# Patient Record
Sex: Female | Born: 1985
Health system: Southern US, Community
[De-identification: ages and names within clinical notes are randomized; demographics above are authoritative.]

## PROBLEM LIST (undated history)

## (undated) DIAGNOSIS — R000 Tachycardia, unspecified: Secondary | ICD-10-CM

## (undated) DIAGNOSIS — D649 Anemia, unspecified: Secondary | ICD-10-CM

## (undated) DIAGNOSIS — R030 Elevated blood-pressure reading, without diagnosis of hypertension: Secondary | ICD-10-CM

## (undated) DIAGNOSIS — N920 Excessive and frequent menstruation with regular cycle: Secondary | ICD-10-CM

## (undated) DIAGNOSIS — R002 Palpitations: Secondary | ICD-10-CM

## (undated) DIAGNOSIS — O4690 Antepartum hemorrhage, unspecified, unspecified trimester: Secondary | ICD-10-CM

## (undated) DIAGNOSIS — J45909 Unspecified asthma, uncomplicated: Secondary | ICD-10-CM

## (undated) DIAGNOSIS — N912 Amenorrhea, unspecified: Secondary | ICD-10-CM

## (undated) DIAGNOSIS — Z87891 Personal history of nicotine dependence: Secondary | ICD-10-CM

## (undated) DIAGNOSIS — D509 Iron deficiency anemia, unspecified: Secondary | ICD-10-CM

## (undated) DIAGNOSIS — E348 Other specified endocrine disorders: Secondary | ICD-10-CM

## (undated) DIAGNOSIS — K649 Unspecified hemorrhoids: Secondary | ICD-10-CM

## (undated) DIAGNOSIS — Z8639 Personal history of other endocrine, nutritional and metabolic disease: Secondary | ICD-10-CM

## (undated) DIAGNOSIS — G5602 Carpal tunnel syndrome, left upper limb: Secondary | ICD-10-CM

---
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
## 2004-12-14 ENCOUNTER — Ambulatory Visit: Payer: Self-pay | Admitting: Family Medicine

## 2008-01-25 ENCOUNTER — Ambulatory Visit: Payer: Self-pay | Admitting: Family Medicine

## 2009-09-21 ENCOUNTER — Ambulatory Visit: Payer: Self-pay | Admitting: Family Medicine

## 2009-09-22 ENCOUNTER — Emergency Department: Payer: Self-pay | Admitting: Internal Medicine

## 2010-08-05 ENCOUNTER — Ambulatory Visit: Payer: Self-pay | Admitting: Internal Medicine

## 2012-04-26 ENCOUNTER — Encounter: Payer: Self-pay | Admitting: Obstetrics & Gynecology

## 2012-08-19 ENCOUNTER — Observation Stay: Payer: Self-pay

## 2012-10-07 ENCOUNTER — Observation Stay: Payer: Self-pay | Admitting: Obstetrics and Gynecology

## 2012-10-17 ENCOUNTER — Inpatient Hospital Stay: Payer: Self-pay

## 2012-10-17 LAB — CBC WITH DIFFERENTIAL/PLATELET
Basophil %: 0.2 %
Eosinophil #: 0.2 10*3/uL (ref 0.0–0.7)
Eosinophil %: 1.7 %
HCT: 34.4 % — ABNORMAL LOW (ref 35.0–47.0)
HGB: 11.8 g/dL — ABNORMAL LOW (ref 12.0–16.0)
Lymphocyte #: 2.5 10*3/uL (ref 1.0–3.6)
Lymphocyte %: 19.5 %
MCHC: 34.2 g/dL (ref 32.0–36.0)
MCV: 81 fL (ref 80–100)
Monocyte %: 7.5 %
Neutrophil #: 9 10*3/uL — ABNORMAL HIGH (ref 1.4–6.5)
Neutrophil %: 71.1 %
RBC: 4.24 10*6/uL (ref 3.80–5.20)
RDW: 15.6 % — ABNORMAL HIGH (ref 11.5–14.5)

## 2012-10-18 LAB — PIH PROFILE
Anion Gap: 9 (ref 7–16)
BUN: 13 mg/dL (ref 7–18)
Chloride: 109 mmol/L — ABNORMAL HIGH (ref 98–107)
Co2: 22 mmol/L (ref 21–32)
Creatinine: 0.69 mg/dL (ref 0.60–1.30)
HGB: 12.2 g/dL (ref 12.0–16.0)
MCH: 27.7 pg (ref 26.0–34.0)
MCHC: 34 g/dL (ref 32.0–36.0)
MCV: 82 fL (ref 80–100)
Potassium: 3.9 mmol/L (ref 3.5–5.1)
RBC: 4.42 10*6/uL (ref 3.80–5.20)
SGOT(AST): 19 U/L (ref 15–37)
Sodium: 140 mmol/L (ref 136–145)
Uric Acid: 4.2 mg/dL (ref 2.6–6.0)

## 2012-10-20 LAB — HEMATOCRIT: HCT: 28.2 % — ABNORMAL LOW

## 2012-10-22 LAB — PATHOLOGY REPORT

## 2014-05-10 ENCOUNTER — Emergency Department: Payer: Self-pay | Admitting: Emergency Medicine

## 2014-05-10 LAB — CBC
HCT: 42 % (ref 35.0–47.0)
HGB: 14 g/dL (ref 12.0–16.0)
MCH: 27.5 pg (ref 26.0–34.0)
MCHC: 33.4 g/dL (ref 32.0–36.0)
MCV: 82 fL (ref 80–100)
Platelet: 248 10*3/uL (ref 150–440)
RBC: 5.1 10*6/uL (ref 3.80–5.20)
RDW: 12.7 % (ref 11.5–14.5)
WBC: 8.6 10*3/uL (ref 3.6–11.0)

## 2014-05-10 LAB — BASIC METABOLIC PANEL
Anion Gap: 10 (ref 7–16)
BUN: 13 mg/dL (ref 7–18)
CALCIUM: 8.9 mg/dL (ref 8.5–10.1)
CO2: 25 mmol/L (ref 21–32)
Chloride: 106 mmol/L (ref 98–107)
Creatinine: 0.7 mg/dL (ref 0.60–1.30)
EGFR (African American): >60
EGFR (Non-African Amer.): >60
GLUCOSE: 114 mg/dL — AB (ref 65–99)
OSMOLALITY: 282 (ref 275–301)
Potassium: 3.5 mmol/L (ref 3.5–5.1)
Sodium: 141 mmol/L (ref 136–145)

## 2014-05-10 LAB — TROPONIN I

## 2015-04-07 NOTE — Op Note (Signed)
PATIENT NAME:  Wendy Hunt, Wendy Hunt MR#:  [id number] DATE OF BIRTH:  03/28/1986  DATE OF PROCEDURE:  10/19/2012  PREOPERATIVE DIAGNOSES:  1. Acute phase arrest. 2. 37 + [redacted] weeks gestation.  3. Mild preeclampsia.   POSTOPERATIVE DIAGNOSES:  1. Acute phase arrest. 2. 37 + [redacted] weeks gestation.  3. Mild preeclampsia.   PROCEDURES:  1. Primary low transverse cesarean section.  2. On-Q pump placement.   ANESTHESIA: Surgical dosing of continuous lumbar epidural.   SURGEON: Suzy Bouchard, MD  FIRST ASSISTANTYetta Barre nurse midwife.   INDICATIONS: A 29 year old gravida 1, para 0 patient at 30 + 1 weeks at time of delivery. Patient was admitted 28 hours previously and was started on Cervidil induction. Patient did not progress past 6 cm despite adequate contraction pattern.   DESCRIPTION OF PROCEDURE: After adequate surgical dosing of continuous lumbar epidural, patient was placed in dorsal supine position hip roll under the right side. Patient's abdomen was prepped and draped in normal sterile fashion. She did receive 2 grams IV Ancef prior to commencement of the case. Pfannenstiel incision was made two fingerbreadths above the symphysis pubis. Sharp dissection was used to identify the fascia which was opened in the midline and opened in a transverse fashion. Superior aspect of fascia was grasped with Kocher clamps. Recti muscles dissected free. Inferior aspect of the fascia was grasped with Kocher clamps. Pyramidalis muscle was dissected free. Entry into the peritoneal cavity was accomplished sharply. Vesicouterine peritoneal fold was identified and opened and bladder flap was created and bladder was reflected inferiorly. Low transverse uterine incision was made. Upon entry into the endometrial cavity, clear fluid resulted. Wedged fetal head was brought to the incision. Vacuum was applied to the occiput, with one gentle pull of the vacuum fetal head was delivered. Vacuum was removed. Loose nuchal  cord was reduced followed by delivery of the shoulders and the body without difficulty. Vigorous female was passed to nursery staff who assigned Apgar scores of 8 and 9. Patient was having some excessive pain during this part of the procedure and received IV ketamine and IV Versed. Placenta was then manually delivered and the uterus was exteriorized. Endometrial cavity was wiped clean with laparotomy tape. Placenta will be sent to pathology for identification given the mother had a temperature five minutes before start time of 100.6. Uterine incision was then closed with 1 chromic suture in a running locking fashion, good approximation of edges. Several figure-of-eight sutures used for good hemostasis. Ovaries and fallopian tubes appeared normal. The posterior cul-de-sac was irrigated and suctioned and uterus was placed back into the abdominal cavity. Paracolic gutters were wiped clean with laparotomy tape. Uterine incision again appeared hemostatic and Interceed placed over the lower uterine incision in a T-shaped fashion. Superior aspect of the fascia was grasped with Kocher clamps and the On-Q pump catheters were advanced from the infraumbilical site subfascially. Each catheter was advanced subfascially. The fascia was then closed over top of the catheters with 0 Vicryl suture in a running nonlocking fashion. Subcutaneous tissues were irrigated and bovied for hemostasis and skin was reapproximated with staples. The On-Q pump catheters were loaded with 5 mL of 0.5% Marcaine each. Sterile dressing applied onto the catheters and dressing applied to the incision. No complications. Estimated blood loss 800 mL. Intraoperative fluids 1200 mL. Patient tolerated the procedure well and was taken to recovery room in good condition.   ____________________________ Suzy Bouchard, MD tjs:cms D: 10/19/2012 02:37:16 ET T: 10/19/2012 07:41:32 ET JOB#:  [id number]  cc: Suzy Bouchardhomas J. Schermerhorn, MD, <Dictator> Suzy BouchardHOMAS J  SCHERMERHORN MD ELECTRONICALLY SIGNED 10/22/2012 8:50

## 2015-04-07 NOTE — Discharge Summary (Signed)
PATIENT NAME:  Wendy Hunt, Yanessa E MR#:  [id number] DATE OF BIRTH:  [birth year]  DATE OF ADMISSION:  10/17/2012 DATE OF DISCHARGE:  10/22/2012  HOSPITAL COURSE: The patient came in for an induction, advanced to 6 cm, did not progress past that. After adequate evaluation and contraction pattern, the patient underwent a primary low transverse cesarean section, which was uncomplicated. Postoperative day number one hematocrit 28.2%. The patient was discharged to home on postoperative day number three. Blood pressure is normalizing. The patient's discharge medications are Norco and Motrin. The patient will follow-up with Dr. Feliberto GottronSchermerhorn in two weeks for wound care or before if she has problems with wound drainage, fever, nausea, or vomiting.  ____________________________ Suzy Bouchardhomas J. Eryx Zane, MD tjs:slb D: 10/25/2012 09:09:26 ET T: 10/25/2012 14:25:37 ET JOB#: [id number]  cc: Suzy Bouchardhomas J. Chealsey Miyamoto, MD, <Dictator> Suzy BouchardHOMAS J Ilea Hilton MD ELECTRONICALLY SIGNED 10/28/2012 10:53

## 2015-04-28 NOTE — H&P (Signed)
L&D Evaluation:  History:   HPI 29 yo G1P0 with LMP of 01/22/12 & EDD of 11/07/12 [facility] "irreg periods and infertility patient" who was walking a mile today and now c/o "pain in the lower groin and lower pelvic discomfort now", No LOF, no VB or decreased FM.    Presents with abdominal pain    Patient's Medical History 4'11" Ht, Irreg periods and infertility over many years    Patient's Surgical History wisdom teeth extraction    Medications Pre Natal Vitamins    Allergies Sulfa    Social History none    Family History Non-Contributory   ROS:   ROS All systems were reviewed.  HEENT, CNS, GI, GU, Respiratory, CV, Renal and Musculoskeletal systems were found to be normal.   Exam:   Vital Signs stable  120/80 previously 130/86    Urine Protein trace    General no apparent distress    Mental Status clear    Chest clear    Heart normal sinus rhythm, no murmur/gallop/rubs    Abdomen gravid, non-tender    Estimated Fetal Weight Average for gestational age    Fetal Position Footling breech    Back no CVAT    Edema 1+    Reflexes 1+    Clonus negative    Pelvic L/C/P no presenting part    Mebranes Intact    FHT normal rate with no decels, NST age approp    Ucx irregular    Skin dry    Lymph no lymphadenopathy   Impression:   Impression IUP at 28 5/7 week with Th PTL   Plan:   Plan monitor contractions and for cervical change, PIH panel, Pt upset and crying as she is fearful ZO:XWRUre:baby and safety    Comments Terb x 1 will be given.   Electronic Signatures: Sharee PimpleJones, Caron W (CNM)  (Signed 01-Sep-13 22:58)  Authored: L&D Evaluation   Last Updated: 01-Sep-13 22:58 by Sharee PimpleJones, Caron W (CNM)

## 2015-04-28 NOTE — H&P (Signed)
L&D Evaluation:  History:   HPI [age]yo G1P0 with LMP of 01/22/12 & [facility] 11[facility]07/[facility], primip, Th PTL, polyhydramnios, EFW yest 6#13, Anemia, 24 h prot/creat 10/16 normal. Pt has agreed to IOL for Gest HTN and poly and is 37 weeks today. Pt is 294ft11 and at risk for CPD.    Presents with IOL    Patient's Medical History Anemia    Patient's Surgical History none    Medications Pre Natal Vitamins    Allergies Sulfa    Social History none    Family History Non-Contributory   ROS:   ROS All systems were reviewed.  HEENT, CNS, GI, GU, Respiratory, CV, Renal and Musculoskeletal systems were found to be normal.   Exam:   Vital Signs stable    General no apparent distress    Mental Status clear    Chest clear    Heart normal sinus rhythm, no murmur/gallop/rubs    Abdomen gravid, non-tender    Estimated Fetal Weight Average for gestational age    Fetal Position vtx    Back no CVAT    Edema 2+    Reflexes 2+    Pelvic no external lesions    Mebranes Intact    FHT normal rate with no decels, 130    Ucx regular    Skin dry    Lymph no lymphadenopathy   Impression:   Impression IUP at 37 weeks with GEst HTN   Plan:   Plan EFM/NST, monitor contractions and for cervical change    Comments Disc at length with pt and husband and pt's mom risks of labor induction including failure and need for lTCS. Disc goal for today is safe mOM AND SAFE BABY!. Cx is closed/mid/40%/vtx -1. Pt does agree to continue with trial of labor. Aware of the risks of poly and cord prolapse and need for STAT C-section.  Pt informed of Pitocin and the risks including maternal or fetal intolerance. Pt aware of PP hemorrhage, abruption of uturus, risks of anethesia and bleeding possibilities with delivery. Aware of risks of infection. We also disc risks of LTCS including damage to internal organs, risks of bleeding, infection, anesthesia risks and pt  understands that surgery is a risk. WE also addressed the risks of death. Pt and husband agree with plan for today. Dr. Feliberto GottronSchermerhorn is aware of pt status and agrees with plans for IOL. Epidural wll be done now so that we can intervene with vag exams and possibly foly bulb. Pt is unable to have cx exams without rearing back in the bed due to discomfort so feel epidural is indicated to get the pt started.   Electronic Signatures: Sharee PimpleJones, Caron W (CNM)  (Signed 31-Oct-13 09:12)  Authored: L&D Evaluation   Last Updated: 31-Oct-13 09:12 by Sharee PimpleJones, Caron W (CNM)

## 2015-07-17 ENCOUNTER — Encounter: Payer: Self-pay | Admitting: Family Medicine

## 2015-07-17 ENCOUNTER — Ambulatory Visit (INDEPENDENT_AMBULATORY_CARE_PROVIDER_SITE_OTHER): Payer: BLUE CROSS/BLUE SHIELD | Admitting: Family Medicine

## 2015-07-17 VITALS — BP 100/64 | HR 92 | Temp 98.2°F | Resp 16 | Ht 59.0 in | Wt 146.0 lb

## 2015-07-17 DIAGNOSIS — J45909 Unspecified asthma, uncomplicated: Secondary | ICD-10-CM | POA: Insufficient documentation

## 2015-07-17 DIAGNOSIS — K649 Unspecified hemorrhoids: Secondary | ICD-10-CM | POA: Diagnosis not present

## 2015-07-17 DIAGNOSIS — N920 Excessive and frequent menstruation with regular cycle: Secondary | ICD-10-CM | POA: Insufficient documentation

## 2015-07-17 DIAGNOSIS — K648 Other hemorrhoids: Secondary | ICD-10-CM | POA: Diagnosis not present

## 2015-07-17 DIAGNOSIS — R635 Abnormal weight gain: Secondary | ICD-10-CM | POA: Diagnosis not present

## 2015-07-17 DIAGNOSIS — K644 Residual hemorrhoidal skin tags: Secondary | ICD-10-CM | POA: Insufficient documentation

## 2015-07-17 DIAGNOSIS — D509 Iron deficiency anemia, unspecified: Secondary | ICD-10-CM | POA: Insufficient documentation

## 2015-07-17 MED ORDER — PHENTERMINE HCL 30 MG PO CAPS: 30.0000 mg | ORAL_CAPSULE | ORAL | Status: DC

## 2015-07-17 NOTE — Progress Notes (Signed)
   Subjective:    Patient ID: Marline Backbone Ward, female    DOB: [birth year], 29 y.o.   MRN: [id number]  HPI   Hemorrhoids: Patient complains of follow up of hemorrhoids. Onset of symptoms was gradual starting several years ago ago with stable course since that time.  She describes symptoms as Pt has not had a symptoms. Treatment to date has been none.  Also, concerned about her weight. Has been on Weight Watchers for 6 weeks. She has lost 4 pounds. Feels like she needs a something to give her a jump start.     Patient Active Problem List   Diagnosis Date Noted  . Anemia, iron deficiency 07/17/2015  . Excess, menstruation 07/17/2015  . RAD (reactive airway disease) 07/17/2015   Family History  Problem Relation Age of Onset  . Healthy Mother   . Hyperlipidemia Mother   . Healthy Father     Pre-DM and hypochondriac  . Diabetes Maternal Grandfather   . Diabetes Paternal Grandmother   . Breast cancer Paternal Grandmother   . Esophageal cancer Paternal Grandfather   . Diabetes Other   . Glaucoma Other   . Angina Maternal Grandmother    History   Social History  . Marital Status: Married    Spouse Name: N/A  . Number of Children: 1  . Years of Education: College   Occupational History  .      Full-time   Social History Main Topics  . Smoking status: Former Smoker -- 0.50 packs/day for 8 years  . Smokeless tobacco: Never Used  . Alcohol Use: No  . Drug Use: No  . Sexual Activity: Not on file   Other Topics Concern  . Not on file   Social History Narrative   Allergies  Allergen Reactions  . Sulfa Antibiotics    Previous Medications   ETONOGESTREL (IMPLANON) 68 MG IMPL IMPLANT    Inject into the skin.   BP 100/64 mmHg  Pulse 92  Temp(Src) 98.2 F (36.8 C) (Oral)  Resp 16  Ht  (1.499 m)  Wt 146 lb (66.225 kg)  BMI 29.47 kg/m2  LMP 07/13/2015 (Exact Date)    Review of Systems  Constitutional: Negative.   Gastrointestinal: Positive for blood in stool  (Occasionally with hemorrhoid flare) and rectal pain (occasionally ).      Objective:   Physical Exam  Constitutional: She is oriented to person, place, and time. She appears well-developed and well-nourished.  Genitourinary: Rectal exam shows external hemorrhoid.  Neurological: She is alert and oriented to person, place, and time.   BP 100/64 mmHg  Pulse 92  Temp(Src) 98.2 F (36.8 C) (Oral)  Resp 16  Ht  (1.499 m)  Wt 146 lb (66.225 kg)  BMI 29.47 kg/m2  LMP 07/13/2015 (Exact Date)     Assessment & Plan:   1. Weight gain Has not been as successful as she would like.  Would like medication to help to motivate her. Understands the possibility of increased blood pressure, palpitations, or CVA.  Would still like to proceed with medication. Will stop if any side effects. Continue Weight Watchers. Recheck in 4 weeks.   2. External hemorrhoids Follow up if worsens or does not improve.   Lorie Phenix, MD

## 2015-08-14 ENCOUNTER — Ambulatory Visit (INDEPENDENT_AMBULATORY_CARE_PROVIDER_SITE_OTHER): Payer: BLUE CROSS/BLUE SHIELD | Admitting: Family Medicine

## 2015-08-14 ENCOUNTER — Encounter: Payer: Self-pay | Admitting: Family Medicine

## 2015-08-14 VITALS — BP 102/66 | HR 92 | Temp 98.6°F | Resp 16 | Ht 59.0 in | Wt 136.0 lb

## 2015-08-14 DIAGNOSIS — R635 Abnormal weight gain: Secondary | ICD-10-CM | POA: Diagnosis not present

## 2015-08-14 DIAGNOSIS — K648 Other hemorrhoids: Secondary | ICD-10-CM

## 2015-08-14 DIAGNOSIS — K644 Residual hemorrhoidal skin tags: Secondary | ICD-10-CM

## 2015-08-14 MED ORDER — PHENTERMINE HCL 30 MG PO CAPS: 30.0000 mg | ORAL_CAPSULE | ORAL | Status: DC

## 2015-08-14 NOTE — Progress Notes (Signed)
Patient ID: Wendy Hunt, female   DOB: 03-Jun-1986, 29 y.o.   MRN: [id number]         Patient: Wendy Hunt Female    DOB: 12/07/1986   29 y.o.   MRN: [id number] Visit Date: 08/14/2015  Today's Provider: Lorie Phenix, MD   Chief Complaint  Patient presents with  . Nutrition Counseling   Subjective:    HPI  Pt comes into today to follow up with her weight.  She started phentermine about four weeks ago and she reports tolerating it well.  She has been monitoring her blood pressure and states it has been running low.  She has had a 10 pound weight loss so far and has plateaued in the last two weeks.  She is very motivated to continue with her weight loss.   Would like to continue for another month.    For her hemorrhoids, still giving her a problem at times. May be ready for surgery in the fall.      Allergies  Allergen Reactions  . Sulfa Antibiotics    Previous Medications   ETONOGESTREL (IMPLANON) 68 MG IMPL IMPLANT    Inject into the skin.   PHENTERMINE 30 MG CAPSULE    Take 1 capsule (30 mg total) by mouth every morning.    Review of Systems  Constitutional: Negative.   Respiratory: Negative.   Cardiovascular: Negative.   Gastrointestinal: Positive for constipation. Negative for nausea, vomiting, abdominal pain, diarrhea, blood in stool, abdominal distention, anal bleeding and rectal pain.  Neurological: Negative.     Social History  Substance Use Topics  . Smoking status: Former Smoker -- 0.50 packs/day for 8 years  . Smokeless tobacco: Never Used  . Alcohol Use: No   Objective:   BP 102/66 mmHg  Pulse 92  Temp(Src) 98.6 F (37 C) (Oral)  Resp 16  Ht  (1.499 m)  Wt 136 lb (61.689 kg)  BMI 27.45 kg/m2  LMP 07/13/2015 (Exact Date)  Physical Exam  Constitutional: She is oriented to person, place, and time. She appears well-developed and well-nourished.  Neurological: She is alert and oriented to person, place, and time.       Assessment &  Plan:     1. Weight gain Improved. No side effects. Would like to continue for 2 more months.  - phentermine 30 MG capsule; Take 1 capsule (30 mg total) by mouth every morning.  Dispense: 30 capsule; Refill: 0   2. External hemorrhoids Will refer when is ready for more definitive treatment and will refer.        Lorie Phenix, MD  [facility]

## 2015-10-22 ENCOUNTER — Encounter: Payer: BLUE CROSS/BLUE SHIELD | Admitting: Family Medicine

## 2015-12-31 ENCOUNTER — Encounter: Payer: Self-pay | Admitting: Family Medicine

## 2015-12-31 ENCOUNTER — Ambulatory Visit (INDEPENDENT_AMBULATORY_CARE_PROVIDER_SITE_OTHER): Payer: BLUE CROSS/BLUE SHIELD | Admitting: Family Medicine

## 2015-12-31 VITALS — BP 102/66 | HR 93 | Temp 98.3°F | Resp 16

## 2015-12-31 DIAGNOSIS — Z23 Encounter for immunization: Secondary | ICD-10-CM | POA: Diagnosis not present

## 2015-12-31 DIAGNOSIS — D509 Iron deficiency anemia, unspecified: Secondary | ICD-10-CM

## 2015-12-31 DIAGNOSIS — R202 Paresthesia of skin: Secondary | ICD-10-CM | POA: Diagnosis not present

## 2015-12-31 DIAGNOSIS — Z0000 Encounter for general adult medical examination without abnormal findings: Secondary | ICD-10-CM | POA: Diagnosis not present

## 2015-12-31 NOTE — Progress Notes (Signed)
Patient ID: Wendy Hunt, female   DOB: 10/27/1986, 30 y.o.   MRN: [id number]       Patient: Wendy Hunt, Female    DOB: 10/27/1986, 30 y.o.   MRN: [id number] Visit Date: 12/31/2015  Today's Provider: Lorie PhenixNancy Danesha Kirchoff, MD   Chief Complaint  Patient presents with  . Annual Exam   Subjective:    Annual physical exam Wendy Hunt is a 30 y.o. female who presents today for health maintenance and complete physical. She feels well. She reports exercising none. She reports she is sleeping well. 08/28/14 CPE 02/09/11 Pap-neg; HPV-neg; per pt 2014 pap-normal -----------------------------------------------------------------   Review of Systems  Constitutional: Positive for unexpected weight change.  HENT: Negative.   Eyes: Negative.   Respiratory: Negative.   Cardiovascular: Negative.   Gastrointestinal: Positive for abdominal pain.  Endocrine: Negative.   Genitourinary: Negative.   Musculoskeletal: Negative.   Skin: Negative.   Allergic/Immunologic: Negative.   Neurological: Positive for numbness.  Hematological: Negative.   Psychiatric/Behavioral: Negative.     Social History      She  reports that she quit smoking about 4 years ago. She has never used smokeless tobacco. She reports that she does not drink alcohol or use illicit drugs.       Social History   Social History  . Marital Status: Married    Spouse Name: N/A  . Number of Children: 1  . Years of Education: College   Occupational History  .      Full-time   Social History Main Topics  . Smoking status: Former Smoker -- 0.50 packs/day for 8 years    Quit date: 12/19/2011  . Smokeless tobacco: Never Used  . Alcohol Use: No  . Drug Use: No  . Sexual Activity: Not Asked   Other Topics Concern  . None   Social History Narrative    History reviewed. No pertinent past medical history.   Patient Active Problem List   Diagnosis Date Noted  . Anemia, iron deficiency 07/17/2015  . Excess,  menstruation 07/17/2015  . RAD (reactive airway disease) 07/17/2015  . Weight gain 07/17/2015  . External hemorrhoids 07/17/2015    Past Surgical History  Procedure Laterality Date  . Wisdom tooth extraction  2003  . Cesarean section  November 2013    Family History        Family Status  Relation Status Death Age  . Mother Alive   . Father Alive         Her family history includes Angina in her maternal grandmother; Breast cancer in her paternal grandmother; Diabetes in her maternal grandfather, other, and paternal grandmother; Esophageal cancer in her paternal grandfather; Glaucoma in her other; Healthy in her father and mother; Hyperlipidemia in her mother.    Allergies  Allergen Reactions  . Sulfa Antibiotics     Previous Medications   PRENATAL 28-0.8 MG TABS    Take 1 tablet by mouth daily.    Patient Care Team: Lorie PhenixNancy Weslee Fogg, MD as PCP - General (Family Medicine)     Objective:   Vitals: BP 102/66 mmHg  Pulse 93  Temp(Src) 98.3 F (36.8 C)  Resp 16  SpO2 98%  LMP 12/29/2015 (Exact Date)   Physical Exam  Constitutional: She is oriented to person, place, and time. She appears well-developed and well-nourished.  HENT:  Head: Normocephalic and atraumatic.  Right Ear: Tympanic membrane, external ear and ear canal normal.  Left Ear: Tympanic membrane, external ear and ear canal normal.  Nose: Nose normal.  Mouth/Throat: Uvula is midline, oropharynx is clear and moist and mucous membranes are normal.  Eyes: Conjunctivae, EOM and lids are normal. Pupils are equal, round, and reactive to light.  Neck: Trachea normal and normal range of motion. Neck supple. Carotid bruit is not present. No thyroid mass and no thyromegaly present.  Cardiovascular: Normal rate, regular rhythm and normal heart sounds.   Pulmonary/Chest: Effort normal and breath sounds normal.  Abdominal: Soft. Normal appearance and bowel sounds are normal. There is no hepatosplenomegaly. There is no  tenderness.  Genitourinary: No breast swelling, tenderness or discharge.  Musculoskeletal: Normal range of motion.  Lymphadenopathy:    She has no cervical adenopathy.    She has no axillary adenopathy.  Neurological: She is alert and oriented to person, place, and time. She has normal strength. No cranial nerve deficit.  Skin: Skin is warm, dry and intact.  Psychiatric: She has a normal mood and affect. Her speech is normal and behavior is normal. Judgment and thought content normal. Cognition and memory are normal.     Assessment & Plan:     Routine Health Maintenance and Physical Exam  Exercise Activities and Dietary recommendations Goals    . Exercise 150 minutes per week (moderate activity)       Immunization History  Administered Date(s) Administered  . HPV Quadrivalent 02/09/2011      1. Annual physical exam Stable. Patient advised to continue eating healthy and exercise daily.  2. Need for influenza vaccination - Flu Vaccine QUAD 36+ mos IM  3. Paresthesia New problem. F/U pending lab report. - Vitamin B12 - TSH  4. Anemia, iron deficiency - CBC with Differential/Platelet - Comprehensive metabolic panel     Patient seen and examined by Dr. Leo Grosser, and note scribed by Liz Beach. Dimas, CMA.  I have reviewed the document for accuracy and completeness and I agree with above. Leo Grosser, MD   Lorie Phenix, MD    --------------------------------------------------------------------

## 2016-01-01 ENCOUNTER — Telehealth: Payer: Self-pay

## 2016-01-01 LAB — COMPREHENSIVE METABOLIC PANEL
A/G RATIO: 1.8 (ref 1.1–2.5)
ALK PHOS: 48 IU/L (ref 39–117)
ALT: 30 IU/L (ref 0–32)
AST: 25 IU/L (ref 0–40)
Albumin: 4.6 g/dL (ref 3.5–5.5)
BUN/Creatinine Ratio: 13 (ref 8–20)
BUN: 9 mg/dL (ref 6–20)
Bilirubin Total: 0.3 mg/dL (ref 0.0–1.2)
CO2: 26 mmol/L (ref 18–29)
Calcium: 9.1 mg/dL (ref 8.7–10.2)
Chloride: 99 mmol/L (ref 96–106)
Creatinine, Ser: 0.7 mg/dL (ref 0.57–1.00)
GFR calc non Af Amer: 117 mL/min/{1.73_m2} (ref >59)
GFR, EST AFRICAN AMERICAN: 134 mL/min/{1.73_m2} (ref >59)
GLUCOSE: 156 mg/dL — AB (ref 65–99)
Globulin, Total: 2.5 g/dL (ref 1.5–4.5)
POTASSIUM: 3.6 mmol/L (ref 3.5–5.2)
Sodium: 140 mmol/L (ref 134–144)
TOTAL PROTEIN: 7.1 g/dL (ref 6.0–8.5)

## 2016-01-01 LAB — TSH: TSH: 1.07 u[IU]/mL (ref 0.450–4.500)

## 2016-01-01 LAB — CBC WITH DIFFERENTIAL/PLATELET
BASOS ABS: 0 10*3/uL (ref 0.0–0.2)
Basos: 0 %
EOS (ABSOLUTE): 0.3 10*3/uL (ref 0.0–0.4)
Eos: 3 %
Hematocrit: 40.7 % (ref 34.0–46.6)
Hemoglobin: 13.4 g/dL (ref 11.1–15.9)
IMMATURE GRANS (ABS): 0 10*3/uL (ref 0.0–0.1)
Immature Granulocytes: 0 %
LYMPHS ABS: 2.6 10*3/uL (ref 0.7–3.1)
Lymphs: 29 %
MCH: 27.6 pg (ref 26.6–33.0)
MCHC: 32.9 g/dL (ref 31.5–35.7)
MCV: 84 fL (ref 79–97)
MONOS ABS: 0.4 10*3/uL (ref 0.1–0.9)
Monocytes: 5 %
NEUTROS ABS: 5.5 10*3/uL (ref 1.4–7.0)
Neutrophils: 63 %
PLATELETS: 287 10*3/uL (ref 150–379)
RBC: 4.86 x10E6/uL (ref 3.77–5.28)
RDW: 13.1 % (ref 12.3–15.4)
WBC: 8.8 10*3/uL (ref 3.4–10.8)

## 2016-01-01 LAB — VITAMIN B12: VITAMIN B 12: 760 pg/mL (ref 211–946)

## 2016-01-01 NOTE — Telephone Encounter (Signed)
Left message to call back  

## 2016-01-01 NOTE — Telephone Encounter (Signed)
Pt wanted to let you know that she forgot she ate a whole bag of gummy bears before her physical yesterday, which is the cause of her elevated BS. Allene DillonEmily Drozdowski, CMA

## 2016-01-01 NOTE — Telephone Encounter (Signed)
-----   Message from Lorie PhenixNancy Maloney, MD sent at 01/01/2016  8:53 AM EST ----- Labs stable except for an unusually high blood sugar. Add HgbA1c. Thanks.

## 2016-01-01 NOTE — Telephone Encounter (Signed)
Advised patient as below.  HgbA1c added.

## 2016-01-04 ENCOUNTER — Telehealth: Payer: Self-pay

## 2016-01-04 NOTE — Telephone Encounter (Signed)
LMTCB Milagros Middendorf Drozdowski, CMA  

## 2016-01-04 NOTE — Telephone Encounter (Signed)
-----   Message from Lorie PhenixNancy Maloney, MD sent at 01/02/2016 12:18 PM EST ----- Blood sugar stable. Not diabetic. Please notify patient. Thanks

## 2016-01-04 NOTE — Telephone Encounter (Signed)
Spoke with patient on the and advised her of lab report. KW

## 2016-01-12 ENCOUNTER — Telehealth: Payer: Self-pay

## 2016-01-12 NOTE — Telephone Encounter (Signed)
Pt advised to have someone drive her to the ER.  She says she may wait a little longer to see how she feels.  She did assure me that she would go if she has any different symptoms or worsening symptoms.    Thanks,   -Vernona Rieger

## 2016-01-12 NOTE — Telephone Encounter (Signed)
She needs to have someone drive her to the ER.

## 2016-01-12 NOTE — Telephone Encounter (Signed)
Pt called back to report that she is now having "Blurry distance vision"  She is not sure if this is a new symptom or that she hadn't noticed it before.    Thanks,   -Vernona Rieger

## 2016-01-12 NOTE — Telephone Encounter (Signed)
Pt called in today to report earlier while at work she experienced sudden dizziness, tunnel vision, and felt light she was going to pass out.  She says her blood pressure there was 111/60's, she does not have access to equipment to check her blood sugar.  She says now that she has rested she is feeling better, but now says she is having tingling in her hands and feet.  She also reports "feeling her pulse throughout her body" but "not elevated or palpitations" just an "Odd feeling."  She didn't not want to go to the ER since she was feeling better, but did agree to go if her symptoms worsening again.     Thanks,   -Vernona Rieger

## 2016-01-13 ENCOUNTER — Ambulatory Visit (INDEPENDENT_AMBULATORY_CARE_PROVIDER_SITE_OTHER): Payer: BLUE CROSS/BLUE SHIELD | Admitting: Family Medicine

## 2016-01-13 ENCOUNTER — Encounter: Payer: Self-pay | Admitting: Family Medicine

## 2016-01-13 VITALS — BP 98/70 | HR 75 | Temp 98.0°F | Resp 16 | Wt 137.0 lb

## 2016-01-13 DIAGNOSIS — R202 Paresthesia of skin: Secondary | ICD-10-CM | POA: Diagnosis not present

## 2016-01-13 DIAGNOSIS — R42 Dizziness and giddiness: Secondary | ICD-10-CM

## 2016-01-13 NOTE — Patient Instructions (Addendum)
F/u with Dr. Darrold Junker as scheduled.

## 2016-01-13 NOTE — Progress Notes (Signed)
Subjective:     Patient ID: Wendy Hunt, female   DOB: [birth year], 31 y.o.   MRN: [id number]  HPI  Chief Complaint  Patient presents with  . Dizziness    Patient comes in office today with concerns of dizziness and tingling in her hand and feet. Patient recalls yesterday while at work she began to fill dizzy and she experienced tunnel vision but denies syncope. Patients blood pressure was checked and was within normal limits at 111/70 and glucose was within normal range. Patient reports that tingling in her extremities is still ongoing and she feels pressure in her head and heaviness in both legs.   States she is on her feet most of the day in her job. Was standing when she suddenly felt dizzy - "My vision became pinpoint"-but no vertigo. States she was able to sit down and did not pass out. Feels residual tingling in her extremities and head "pressure" today. Denies palpitations and states she ate breakfast one hour before the episode. LMP 12/29/2015. Recent physical with normal labs including TSH. Caffeine consumption is 2 cups/day.   Review of Systems  Respiratory: Negative for shortness of breath.   Cardiovascular: Negative for chest pain.       Hx of palpitations with negative cardiology evaluation in 2015 per Dr. Darrold Junker.  Neurological:       No hx of migraine headache  Psychiatric/Behavioral:       Denies stress       Objective:   Physical Exam  Constitutional: She appears well-developed and well-nourished. No distress.  Cardiovascular: An irregular rhythm present.  Pulmonary/Chest: Breath sounds normal.       Assessment:    1. Dizziness - EKG 12-Lead - Ambulatory referral to Cardiology  2. Paresthesias - EKG 12-Lead - Ambulatory referral to Cardiology    Plan:    Further f/u pending cardiology referral

## 2016-01-23 LAB — HGB A1C W/O EAG: Hgb A1c MFr Bld: 5.3 % (ref 4.8–5.6)

## 2016-01-23 LAB — SPECIMEN STATUS REPORT

## 2016-02-01 ENCOUNTER — Telehealth: Payer: Self-pay | Admitting: Family Medicine

## 2016-02-01 MED ORDER — AMOXICILLIN 875 MG PO TABS: 875.0000 mg | ORAL_TABLET | Freq: Two times a day (BID) | ORAL | Status: DC

## 2016-02-01 NOTE — Telephone Encounter (Signed)
Pt would like Dr. Elease Hunt to send an antibiotic into [facility] [address]. Pt stated that her son has strep throat and she is pretty sure she has it as well. I advised we don't treat over the phone and offered an appt with PA. Pt stated that she couldn't come in b/c she has a sick child and requested a message be sent back. Please advise. Thanks TNP

## 2016-02-01 NOTE — Telephone Encounter (Signed)
Pt advised.   Thanks,   -Rachyl Wuebker  

## 2016-02-01 NOTE — Telephone Encounter (Signed)
Sent rx. Please notify patient. Thanks.   

## 2016-03-29 ENCOUNTER — Telehealth: Payer: Self-pay | Admitting: Family Medicine

## 2016-03-29 DIAGNOSIS — R635 Abnormal weight gain: Secondary | ICD-10-CM

## 2016-03-29 NOTE — Telephone Encounter (Signed)
Patient wanted to see if you would be willing to give her Phentermine RX again. Her last RX for this was august 26th, 2016 and she took till about November. She went through some stressful times and gained weight back and is at stand still point. She is watching her diet and exercises every other day. She understands she would need to come in for weight check. Please review. Patient states if she does not answer her phone it is ok to leave a message with details on her phone.-aa

## 2016-03-29 NOTE — Telephone Encounter (Signed)
Error see other message below-aa

## 2016-03-29 NOTE — Telephone Encounter (Signed)
Pt requested that Vernona RiegerLaura return her call b/c she would like for Vernona RiegerLaura to discuss something with Dr. Elease HashimotoMaloney for her. I asked what it was about so that Vernona RiegerLaura could go ahead and discuss it with Dr. Elease HashimotoMaloney and then return her call. Pt stated she would discuss it with Vernona RiegerLaura when she returns her call. Thanks TNP

## 2016-03-30 MED ORDER — PHENTERMINE HCL 30 MG PO CAPS: 30.0000 mg | ORAL_CAPSULE | ORAL | Status: DC

## 2016-03-30 NOTE — Telephone Encounter (Signed)
Pt is returning call. Pt states you can leave a message. CB#[phone]

## 2016-03-30 NOTE — Telephone Encounter (Signed)
[id number]-aa 

## 2016-03-30 NOTE — Telephone Encounter (Signed)
Patient advised as directed below. 

## 2016-03-30 NOTE — Telephone Encounter (Signed)
Printed out one month.  Needs ov in one month. Thanks.

## 2016-05-12 ENCOUNTER — Encounter: Payer: Self-pay | Admitting: Family Medicine

## 2016-05-12 ENCOUNTER — Ambulatory Visit (INDEPENDENT_AMBULATORY_CARE_PROVIDER_SITE_OTHER): Payer: BLUE CROSS/BLUE SHIELD | Admitting: Family Medicine

## 2016-05-12 VITALS — BP 104/68 | HR 82 | Temp 98.2°F | Resp 16 | Wt 129.0 lb

## 2016-05-12 DIAGNOSIS — R635 Abnormal weight gain: Secondary | ICD-10-CM

## 2016-05-12 MED ORDER — PHENTERMINE HCL 30 MG PO CAPS: 30.0000 mg | ORAL_CAPSULE | ORAL | Status: DC

## 2016-05-12 NOTE — Progress Notes (Signed)
Patient ID: Wendy Hunt, female   DOB: 01-06-1986, 30 y.o.   MRN: [id number]       Patient: Wendy Hunt Female    DOB: 01-06-1986   30 y.o.   MRN: [id number] Visit Date: 05/12/2016  Today's Provider: Lorie PhenixNancy Lavere Stork, MD   Chief Complaint  Patient presents with  . Follow-up    Needs refills on Phentermine   Subjective:    HPI Pt is here for a 1 month follow up for weight loss. She is doing well on the Phentermine, no side effects. She is exercising about 4 times a week, at least 30 minutes of cardio, sometimes 1 hour depending on what time allows. She has lost about 10 pounds since she was here in January. She reports that she was just over 140 lbs when she started it and today she weighs 129.   She also would like to know if Dr. Elease HashimotoMaloney thinks she needs to start having mammograms, given she has a family history of breast cancer on both maternal and paternal sides of her family. If not starting them now, when should she start getting them.      Allergies  Allergen Reactions  . Sulfa Antibiotics    Previous Medications   PHENTERMINE 30 MG CAPSULE    Take 1 capsule (30 mg total) by mouth every morning.   PRENATAL 28-0.8 MG TABS    Take 1 tablet by mouth daily.    Review of Systems  Constitutional: Negative.   HENT: Negative.   Eyes: Negative.   Respiratory: Negative.   Cardiovascular: Negative.   Gastrointestinal: Negative.   Endocrine: Negative.   Genitourinary: Negative.   Musculoskeletal: Negative.   Allergic/Immunologic: Negative.   Neurological: Negative.   Hematological: Negative.   Psychiatric/Behavioral: Negative.     Social History  Substance Use Topics  . Smoking status: Former Smoker -- 0.50 packs/day for 8 years    Quit date: 12/19/2011  . Smokeless tobacco: Never Used  . Alcohol Use: No   Objective:   BP 104/68 mmHg  Pulse 82  Temp(Src) 98.2 F (36.8 C) (Oral)  Resp 16  Wt 129 lb (58.514 kg)  Physical Exam  Constitutional: She appears  well-developed and well-nourished.  Cardiovascular: Normal rate, regular rhythm, normal heart sounds and intact distal pulses.   Pulmonary/Chest: Effort normal and breath sounds normal.  Skin: Skin is warm and dry.  Psychiatric: She has a normal mood and affect. Her behavior is normal. Judgment and thought content normal.      Assessment & Plan:     1. Weight gain Lost about 10 pounds since starting medication. Give another 2 months. Follow up in winter for a CPE with Jenni.  - phentermine 30 MG capsule; Take 1 capsule (30 mg total) by mouth every morning.  Dispense: 30 capsule; Refill: 1     Patient was seen and examined by Dr. Julieanne Mansonichard Gilbert, and noted scribed by Dimas ChyleBrittany Byrd, CMA. I have reviewed the document for accuracy and completeness and I agree with above. - Leo GrosserNancy J. Jonni Oelkers, MD    Lorie PhenixNancy Shere Eisenhart, MD  [facility]

## 2016-08-25 ENCOUNTER — Encounter: Payer: Self-pay | Admitting: Family Medicine

## 2016-08-25 ENCOUNTER — Ambulatory Visit (INDEPENDENT_AMBULATORY_CARE_PROVIDER_SITE_OTHER): Payer: BLUE CROSS/BLUE SHIELD | Admitting: Family Medicine

## 2016-08-25 ENCOUNTER — Other Ambulatory Visit: Payer: Self-pay | Admitting: Family Medicine

## 2016-08-25 VITALS — BP 96/64 | HR 92 | Temp 97.9°F | Resp 16 | Wt 124.0 lb

## 2016-08-25 DIAGNOSIS — N3090 Cystitis, unspecified without hematuria: Secondary | ICD-10-CM

## 2016-08-25 DIAGNOSIS — R300 Dysuria: Secondary | ICD-10-CM

## 2016-08-25 LAB — POCT URINALYSIS DIPSTICK
Bilirubin, UA: NEGATIVE
GLUCOSE UA: NEGATIVE
KETONES UA: NEGATIVE
Nitrite, UA: NEGATIVE
PROTEIN UA: NEGATIVE
UROBILINOGEN UA: 0.2
pH, UA: 7

## 2016-08-25 MED ORDER — NITROFURANTOIN MONOHYD MACRO 100 MG PO CAPS: 100.0000 mg | ORAL_CAPSULE | Freq: Two times a day (BID) | ORAL | 0 refills | Status: DC

## 2016-08-25 NOTE — Progress Notes (Signed)
Subjective:     Patient ID: Wendy Hunt, female   DOB: 1986/07/28, 30 y.o.   MRN: [id number]  HPI  Chief Complaint  Patient presents with  . Urinary Tract Infection    x 4 days. Dysuria, as well as hematuria. Does have new sexual partner. First sexual encounter with this partner was about 6 days ago.  States it has been years since she has had a UTI. No hx of STD use. States intercourse was unprotected and her symptoms started 3 days later.    Review of Systems  Constitutional: Negative for chills and fever.       Objective:   Physical Exam  Constitutional: She appears well-developed and well-nourished. No distress.  Genitourinary:  Genitourinary Comments: No CVA tenderness       Assessment:    1. Dysuria - POCT urinalysis dipstick - Urine culture - Chlamydia/Gonococcus/Trichomonas, NAA  2. Cystitis - nitrofurantoin, macrocrystal-monohydrate, (MACROBID) 100 MG capsule; Take 1 capsule (100 mg total) by mouth 2 (two) times daily.  Dispense: 14 capsule; Refill: 0    Plan:    Further f/u pending test results.

## 2016-08-25 NOTE — Patient Instructions (Signed)
Further f/u pending culture results.

## 2016-08-27 LAB — CHLAMYDIA/GONOCOCCUS/TRICHOMONAS, NAA
CHLAMYDIA BY NAA: NEGATIVE
Gonococcus by NAA: NEGATIVE
TRICH VAG BY NAA: NEGATIVE

## 2016-08-28 LAB — URINE CULTURE

## 2016-08-31 ENCOUNTER — Encounter: Payer: Self-pay | Admitting: Family Medicine

## 2016-08-31 ENCOUNTER — Telehealth: Payer: Self-pay | Admitting: Family Medicine

## 2016-08-31 NOTE — Telephone Encounter (Signed)
Pt would like the results of here urine test that were done 08/25/16. Pt stated she probably wouldn't be able to answer her phone at work but to please leave the results and any instructions on his voicemail. Thanks TNP

## 2016-08-31 NOTE — Telephone Encounter (Signed)
I did not see labs in chart, I called [facility] and they are faxing you over report. Please advise once you receive report. KW

## 2016-08-31 NOTE — Telephone Encounter (Signed)
Notified of results

## 2017-01-05 ENCOUNTER — Encounter: Payer: BLUE CROSS/BLUE SHIELD | Admitting: Physician Assistant

## 2017-01-19 ENCOUNTER — Ambulatory Visit (INDEPENDENT_AMBULATORY_CARE_PROVIDER_SITE_OTHER): Payer: 59 | Admitting: Physician Assistant

## 2017-01-19 ENCOUNTER — Encounter: Payer: Self-pay | Admitting: Physician Assistant

## 2017-01-19 DIAGNOSIS — Z23 Encounter for immunization: Secondary | ICD-10-CM

## 2017-01-19 NOTE — Progress Notes (Deleted)
Patient: Wendy Hunt, Female    DOB: 10-27-1986, 30 y.o.   MRN: [id number] Visit Date: 01/19/2017  Today's Provider: Margaretann Loveless, PA-C   No chief complaint on file.  Subjective:    Annual physical exam Wendy Hunt is a 31 y.o. female who presents today for health maintenance and complete physical. She feels {DESC; WELL/FAIRLY WELL/POORLY:18703}. She reports exercising ***. She reports she is sleeping {DESC; WELL/FAIRLY WELL/POORLY:18703}. 12/31/15 CPE -----------------------------------------------------------------   Review of Systems  Constitutional: Positive for unexpected weight change.  HENT: Negative.   Eyes: Negative.   Respiratory: Negative.   Cardiovascular: Negative.   Gastrointestinal: Negative.   Endocrine: Negative.   Genitourinary: Negative.   Musculoskeletal: Negative.   Skin: Negative.   Allergic/Immunologic: Negative.   Neurological: Negative.   Hematological: Negative.   Psychiatric/Behavioral: Negative.     Social History      She  reports that she quit smoking about 5 years ago. She has a 4.00 pack-year smoking history. She has never used smokeless tobacco. She reports that she does not drink alcohol or use drugs.       Social History   Social History  . Marital status: Married    Spouse name: N/A  . Number of children: 1  . Years of education: College   Occupational History  .      Full-time   Social History Main Topics  . Smoking status: Former Smoker    Packs/day: 0.50    Years: 8.00    Quit date: 12/19/2011  . Smokeless tobacco: Never Used  . Alcohol use No  . Drug use: No  . Sexual activity: Not on file   Other Topics Concern  . Not on file   Social History Narrative  . No narrative on file    No past medical history on file.   Patient Active Problem List   Diagnosis Date Noted  . Anemia, iron deficiency 07/17/2015  . Excess, menstruation 07/17/2015  . RAD (reactive airway disease) 07/17/2015  .  Airway hyperreactivity 07/17/2015    Past Surgical History:  Procedure Laterality Date  . CESAREAN SECTION  November 2013  . WISDOM TOOTH EXTRACTION  2003    Family History        Family Status  Relation Status  . Mother Alive  . Father Alive  . Maternal Grandfather   . Paternal Grandmother   . Paternal Grandfather   . Other   . Maternal Grandmother         Her family history includes Angina in her maternal grandmother; Breast cancer in her paternal grandmother; Diabetes in her maternal grandfather, other, and paternal grandmother; Esophageal cancer in her paternal grandfather; Glaucoma in her other; Healthy in her father and mother; Hyperlipidemia in her mother.     Allergies  Allergen Reactions  . Sulfa Antibiotics      Current Outpatient Prescriptions:  .  nitrofurantoin, macrocrystal-monohydrate, (MACROBID) 100 MG capsule, Take 1 capsule (100 mg total) by mouth 2 (two) times daily., Disp: 14 capsule, Rfl: 0 .  phentermine 30 MG capsule, Take 1 capsule (30 mg total) by mouth every morning., Disp: 30 capsule, Rfl: 1   Patient Care Team: Margaretann Loveless, PA-C as PCP - General (Family Medicine)      Objective:   Vitals: There were no vitals taken for this visit.   Physical Exam   Depression Screen No flowsheet data found.    Assessment & Plan:  Routine Health Maintenance and Physical Exam  Exercise Activities and Dietary recommendations Goals    . Exercise 150 minutes per week (moderate activity)       Immunization History  Administered Date(s) Administered  . HPV Quadrivalent 02/09/2011  . Influenza,inj,Quad PF,36+ Mos 12/31/2015    Health Maintenance  Topic Date Due  . HIV Screening  05/16/2000  . TETANUS/TDAP  05/16/2004  . PAP SMEAR  05/16/2006  . INFLUENZA VACCINE  07/19/2016     Discussed health benefits of physical activity, and encouraged her to engage in regular exercise appropriate for her age and condition.      --------------------------------------------------------------------    Margaretann LovelessJennifer M Burnette, PA-C  [facility]

## 2017-01-22 NOTE — Progress Notes (Signed)
Patient was unable to stay for her physical but did have a nurse visit for flu vaccine only. Flu vaccine was given to patient without complications.

## 2017-02-09 ENCOUNTER — Encounter: Payer: Self-pay | Admitting: Physician Assistant

## 2017-02-09 ENCOUNTER — Ambulatory Visit (INDEPENDENT_AMBULATORY_CARE_PROVIDER_SITE_OTHER): Payer: 59 | Admitting: Physician Assistant

## 2017-02-09 VITALS — BP 100/76 | HR 96 | Temp 98.4°F | Resp 16 | Wt 129.0 lb

## 2017-02-09 DIAGNOSIS — Z136 Encounter for screening for cardiovascular disorders: Secondary | ICD-10-CM

## 2017-02-09 DIAGNOSIS — Z0000 Encounter for general adult medical examination without abnormal findings: Secondary | ICD-10-CM

## 2017-02-09 DIAGNOSIS — Z113 Encounter for screening for infections with a predominantly sexual mode of transmission: Secondary | ICD-10-CM

## 2017-02-09 DIAGNOSIS — E663 Overweight: Secondary | ICD-10-CM | POA: Diagnosis not present

## 2017-02-09 DIAGNOSIS — Z124 Encounter for screening for malignant neoplasm of cervix: Secondary | ICD-10-CM

## 2017-02-09 DIAGNOSIS — Z13220 Encounter for screening for lipoid disorders: Secondary | ICD-10-CM

## 2017-02-09 LAB — POCT URINALYSIS DIPSTICK
Bilirubin, UA: NEGATIVE
GLUCOSE UA: NEGATIVE
Ketones, UA: NEGATIVE
Leukocytes, UA: NEGATIVE
NITRITE UA: NEGATIVE
PH UA: 6.5
Protein, UA: NEGATIVE
RBC UA: NEGATIVE
SPEC GRAV UA: 1.015
UROBILINOGEN UA: 0.2

## 2017-02-09 MED ORDER — PHENTERMINE HCL 37.5 MG PO TABS: 37.5000 mg | ORAL_TABLET | Freq: Every day | ORAL | 0 refills | Status: DC

## 2017-02-09 NOTE — Patient Instructions (Signed)

## 2017-02-09 NOTE — Progress Notes (Signed)
Patient: Wendy Hunt, Female    DOB: [birth year], 31 y.o.   MRN: [id number] Visit Date: 02/09/2017  Today's Provider: Margaretann LovelessJennifer M Burnette, PA-C   No chief complaint on file.  Subjective:    Annual physical exam Wendy Hunt is a 31 y.o. female who presents today for health maintenance and complete physical. She feels well. She reports exercising daily. She reports she is sleeping well.  Last CPE:12/31/15 Pap:2014 per patient -Normal  Patient also has a new sexual partner who has told her that he has HPV. She is requesting all STD testing today. -----------------------------------------------------------------   Review of Systems  Constitutional: Positive for unexpected weight change.  HENT: Negative.   Eyes: Negative.   Respiratory: Negative.   Cardiovascular: Negative.   Gastrointestinal: Negative.   Endocrine: Negative.   Genitourinary: Positive for vaginal discharge.  Musculoskeletal: Negative.   Skin: Negative.   Allergic/Immunologic: Negative.   Neurological: Negative.   Hematological: Negative.   Psychiatric/Behavioral: Negative.     Social History      She  reports that she quit smoking about 5 years ago. She has a 4.00 pack-year smoking history. She has never used smokeless tobacco. She reports that she does not drink alcohol or use drugs.       Social History   Social History  . Marital status: Married    Spouse name: N/A  . Number of children: 1  . Years of education: College   Occupational History  .      Full-time   Social History Main Topics  . Smoking status: Former Smoker    Packs/day: 0.50    Years: 8.00    Quit date: 12/19/2011  . Smokeless tobacco: Never Used  . Alcohol use No  . Drug use: No  . Sexual activity: Not Asked   Other Topics Concern  . None   Social History Narrative  . None    History reviewed. No pertinent past medical history.   Patient Active Problem List   Diagnosis Date Noted  . Anemia, iron  deficiency 07/17/2015  . Excess, menstruation 07/17/2015  . RAD (reactive airway disease) 07/17/2015  . Airway hyperreactivity 07/17/2015    Past Surgical History:  Procedure Laterality Date  . CESAREAN SECTION  November 2013  . WISDOM TOOTH EXTRACTION  2003    Family History        Family Status  Relation Status  . Mother Alive  . Father Alive  . Maternal Grandfather   . Paternal Grandmother   . Paternal Grandfather   . Other   . Maternal Grandmother         Her family history includes Angina in her maternal grandmother; Breast cancer in her paternal grandmother; Diabetes in her maternal grandfather, other, and paternal grandmother; Esophageal cancer in her paternal grandfather; Glaucoma in her other; Healthy in her father and mother; Hyperlipidemia in her mother.     Allergies  Allergen Reactions  . Sulfa Antibiotics     No current outpatient prescriptions on file.   Patient Care Team: Margaretann LovelessJennifer M Burnette, PA-C as PCP - General (Family Medicine)      Objective:   Vitals: BP 100/76 (BP Location: Left Arm, Patient Position: Sitting, Cuff Size: Normal)   Pulse 96   Temp 98.4 F (36.9 C)   Resp 16   Wt 129 lb (58.5 kg)   LMP 01/19/2017   SpO2 96%   BMI 26.05 kg/m    Physical Exam  Constitutional:  She is oriented to person, place, and time. She appears well-developed and well-nourished. No distress.  HENT:  Head: Normocephalic and atraumatic.  Right Ear: Hearing, tympanic membrane, external ear and ear canal normal.  Left Ear: Hearing, tympanic membrane, external ear and ear canal normal.  Nose: Nose normal.  Mouth/Throat: Uvula is midline, oropharynx is clear and moist and mucous membranes are normal. No oropharyngeal exudate.  Eyes: Conjunctivae and EOM are normal. Pupils are equal, round, and reactive to light. Right eye exhibits no discharge. Left eye exhibits no discharge. No scleral icterus.  Neck: Normal range of motion. Neck supple. No JVD present.  Carotid bruit is not present. No tracheal deviation present. No thyromegaly present.  Cardiovascular: Normal rate, regular rhythm, normal heart sounds and intact distal pulses.  Exam reveals no gallop and no friction rub.   No murmur heard. Pulmonary/Chest: Effort normal and breath sounds normal. No respiratory distress. She has no wheezes. She has no rales. She exhibits no tenderness. Right breast exhibits no inverted nipple, no mass, no nipple discharge, no skin change and no tenderness. Left breast exhibits no inverted nipple, no mass, no nipple discharge, no skin change and no tenderness. Breasts are symmetrical.  Abdominal: Soft. Bowel sounds are normal. She exhibits no distension and no mass. There is no tenderness. There is no rebound and no guarding. Hernia confirmed negative in the right inguinal area and confirmed negative in the left inguinal area.  Genitourinary: Rectum normal, vagina normal and uterus normal. No breast swelling, tenderness, discharge or bleeding. Pelvic exam was performed with patient supine. There is no rash, tenderness, lesion or injury on the right labia. There is no rash, tenderness, lesion or injury on the left labia. Cervix exhibits no motion tenderness, no discharge and no friability. Right adnexum displays no mass, no tenderness and no fullness. Left adnexum displays no mass, no tenderness and no fullness. No erythema, tenderness or bleeding in the vagina. No signs of injury around the vagina. No vaginal discharge found.  Musculoskeletal: Normal range of motion. She exhibits no edema or tenderness.  Lymphadenopathy:    She has no cervical adenopathy.       Right: No inguinal adenopathy present.       Left: No inguinal adenopathy present.  Neurological: She is alert and oriented to person, place, and time. She has normal reflexes. No cranial nerve deficit. Coordination normal.  Skin: Skin is warm and dry. No rash noted. She is not diaphoretic.  Psychiatric: She has a  normal mood and affect. Her behavior is normal. Judgment and thought content normal.  Vitals reviewed.    Depression Screen PHQ 2/9 Scores 02/09/2017  PHQ - 2 Score 0     Assessment & Plan:     Routine Health Maintenance and Physical Exam  Exercise Activities and Dietary recommendations Goals    . Exercise 150 minutes per week (moderate activity)       Immunization History  Administered Date(s) Administered  . HPV Quadrivalent 02/09/2011  . Influenza,inj,Quad PF,36+ Mos 12/31/2015, 01/19/2017    Health Maintenance  Topic Date Due  . HIV Screening  05/16/2000  . TETANUS/TDAP  05/16/2004  . PAP SMEAR  05/16/2006  . INFLUENZA VACCINE  Completed     Discussed health benefits of physical activity, and encouraged her to engage in regular exercise appropriate for her age and condition.    1. Annual physical exam Normal physical exam today. Will check labs as below and f/u pending lab results. If labs are stable  and WNL she will not need to have these rechecked for one year at her next annual physical exam. She is to call the office in the meantime if she has any acute issue, questions or concerns. - POCT urinalysis dipstick - CBC w/Diff/Platelet - Comprehensive Metabolic Panel (CMET) - TSH  2. Cervical cancer screening Pap collected today. Will send as below and f/u pending results. - Pap IG, CT/NG NAA, and HPV (high risk)  3. Screen for STD (sexually transmitted disease) Will check labs as below and f/u pending results. - HIV antibody (with reflex) - RPR - Pap IG, CT/NG NAA, and HPV (high risk)  4. Encounter for lipid screening for cardiovascular disease Will check labs as below and f/u pending results. - Lipid Profile  5. Overweight (BMI 25.0-29.9) She has used phentermine in the past and would like to try one month again. She states she would like to lose about 10-20 pounds. Will give one month of phentermine as below. Advised patient it will not be covered  because her BMI is not high enough. She voiced understanding and still wishes to retry. - phentermine (ADIPEX-P) 37.5 MG tablet; Take 1 tablet (37.5 mg total) by mouth daily before breakfast.  Dispense: 30 tablet; Refill: 0  --------------------------------------------------------------------    Margaretann Loveless, PA-C  [facility]

## 2017-02-12 LAB — PAP IG, CT-NG NAA, HPV HIGH-RISK
CHLAMYDIA, NUC. ACID AMP: NEGATIVE
GONOCOCCUS BY NUCLEIC ACID AMP: NEGATIVE
HPV, HIGH-RISK: NEGATIVE
PAP Smear Comment: 0

## 2017-02-13 ENCOUNTER — Telehealth: Payer: Self-pay

## 2017-02-13 ENCOUNTER — Ambulatory Visit (INDEPENDENT_AMBULATORY_CARE_PROVIDER_SITE_OTHER): Payer: 59 | Admitting: Family Medicine

## 2017-02-13 ENCOUNTER — Encounter: Payer: Self-pay | Admitting: Family Medicine

## 2017-02-13 VITALS — BP 110/84 | HR 87 | Temp 99.1°F | Resp 16 | Wt 128.6 lb

## 2017-02-13 DIAGNOSIS — R0789 Other chest pain: Secondary | ICD-10-CM | POA: Diagnosis not present

## 2017-02-13 DIAGNOSIS — N3091 Cystitis, unspecified with hematuria: Secondary | ICD-10-CM

## 2017-02-13 LAB — POCT URINALYSIS DIPSTICK
Bilirubin, UA: NEGATIVE
Glucose, UA: NEGATIVE
KETONES UA: NEGATIVE
Nitrite, UA: NEGATIVE
Protein, UA: 100
Spec Grav, UA: 1.01
Urobilinogen, UA: 0.2
pH, UA: 6.5

## 2017-02-13 MED ORDER — NITROFURANTOIN MONOHYD MACRO 100 MG PO CAPS: 100.0000 mg | ORAL_CAPSULE | Freq: Two times a day (BID) | ORAL | 0 refills | Status: DC

## 2017-02-13 NOTE — Telephone Encounter (Signed)
Advised pt of lab results. Pt verbally acknowledges understanding. Fatimata Talsma Drozdowski, CMA   

## 2017-02-13 NOTE — Telephone Encounter (Addendum)
Pt called concerned about intermittent sharp chest pain, that is now leaving a dull ache afterward. Is becoming more frequent. Pt denies SOB, arm pain, sweats, back pain. Is concerned because she has a H/O palpitations, but she is not currently having palpitations. Appointment made today at 2:30 per [place]. Allene DillonEmily Drozdowski, CMA

## 2017-02-13 NOTE — Patient Instructions (Signed)
Call for recurrent chest discomfort symptoms for consideration of G.I.referral.

## 2017-02-13 NOTE — Progress Notes (Signed)
Subjective:     Patient ID: Wendy Hunt, female   DOB: 09-Oct-1986, 31 y.o.   MRN: [id number]  HPI  Chief Complaint  Patient presents with  . Chest Pain    Patient comes in office today with complaints of intermittent chest pain in the middle of her chest since 9AM. Patient describes pain as sharp and stabbing that would last for about 30secs than turn into a dull pain. Patient denied palpitations, shortness of breath, numbness, dizziness/lightheadiness, visual disturbance or nausea. Patient denies any history of anxiety.  . Dysuria    Patient complains of burning with urination and seeing blood clots in her urine since this morning  States she noticed slight burning and blood in her urine before she developed the mid-chest spasms. Has residual ache but spasms have resolved. Has had prior cardiology workup last year for palpitations without significant finding. Annual physical completed 2/22 with labs pending.   Review of Systems     Objective:   Physical Exam  Constitutional: She appears well-developed and well-nourished. No distress.  Cardiovascular: Normal rate and regular rhythm.   Pulmonary/Chest: Breath sounds normal.  Musculoskeletal: She exhibits no edema (of lower extremities).  Psychiatric:  anxious       Assessment:    1. Cystitis with hematuria - Urine culture - POCT urinalysis dipstick - nitrofurantoin, macrocrystal-monohydrate, (MACROBID) 100 MG capsule; Take 1 capsule (100 mg total) by mouth 2 (two) times daily.  Dispense: 14 capsule; Refill: 0  2. Chest discomfort: ? Esophageal spasms    Plan:    Will refer to G.I. If chest spasms not resolved with cystitis treatment.

## 2017-02-13 NOTE — Telephone Encounter (Signed)
-----   Message from Margaretann LovelessJennifer M Burnette, PA-C sent at 02/13/2017  8:59 AM EST ----- Pap is normal, negative HPV, GC and chlamydia. We can recheck in 3-5 years.

## 2017-02-13 NOTE — Telephone Encounter (Signed)
lmtcb Emily Drozdowski, CMA  

## 2017-02-15 LAB — URINE CULTURE

## 2017-02-16 ENCOUNTER — Telehealth: Payer: Self-pay

## 2017-02-16 NOTE — Telephone Encounter (Signed)
Patient advised. Patient states she had a few more episodes the same night, but nothing since then.

## 2017-02-16 NOTE — Telephone Encounter (Signed)
-----   Message from Anola Gurneyobert Chauvin, GeorgiaPA sent at 02/16/2017  7:29 AM EST ----- Your bladder infection is sensitive to the antibiotic your are on. Have your chest spasms improved?

## 2017-02-16 NOTE — Telephone Encounter (Signed)
[id number] 

## 2017-05-19 ENCOUNTER — Other Ambulatory Visit: Payer: Self-pay | Admitting: Physician Assistant

## 2017-05-19 NOTE — Telephone Encounter (Signed)
Pt contacted office for refill request on the following medications:  phentermine (ADIPEX-P) 37.5 MG tablet.  CB#[phone]/MW  Pt is asking if she can have the Rx changed from tablet to capsules.  Pt states the capsules worked better/MW

## 2017-05-19 NOTE — Telephone Encounter (Signed)
Please review-aa 

## 2017-05-19 NOTE — Telephone Encounter (Signed)
Patient advised. FU appt scheduled.

## 2017-05-19 NOTE — Telephone Encounter (Signed)
I require appt for weight checks for phentermine.

## 2017-05-25 ENCOUNTER — Encounter: Payer: Self-pay | Admitting: Physician Assistant

## 2017-05-25 ENCOUNTER — Ambulatory Visit (INDEPENDENT_AMBULATORY_CARE_PROVIDER_SITE_OTHER): Payer: 59 | Admitting: Physician Assistant

## 2017-05-25 VITALS — BP 100/80 | HR 84 | Temp 98.4°F | Resp 16 | Ht 59.0 in | Wt 131.2 lb

## 2017-05-25 DIAGNOSIS — Z6826 Body mass index (BMI) 26.0-26.9, adult: Secondary | ICD-10-CM

## 2017-05-25 DIAGNOSIS — Z713 Dietary counseling and surveillance: Secondary | ICD-10-CM

## 2017-05-25 DIAGNOSIS — Z30011 Encounter for initial prescription of contraceptive pills: Secondary | ICD-10-CM

## 2017-05-25 MED ORDER — PHENTERMINE HCL 37.5 MG PO CAPS: 37.5000 mg | ORAL_CAPSULE | ORAL | 0 refills | Status: DC

## 2017-05-25 MED ORDER — NORETHINDRONE ACET-ETHINYL EST 1-20 MG-MCG PO TABS: 1.0000 | ORAL_TABLET | Freq: Every day | ORAL | 11 refills | Status: DC

## 2017-05-25 NOTE — Patient Instructions (Signed)
Ethinyl Estradiol; Norethindrone Acetate; Ferrous fumarate tablets or capsules What is this medicine? ETHINYL ESTRADIOL; NORETHINDRONE ACETATE; FERROUS FUMARATE (ETH in il es tra DYE ole; nor eth IN drone AS e tate; FER us FUE ma rate) is an oral contraceptive. The products combine two types of female hormones, an estrogen and a progestin. They are used to prevent ovulation and pregnancy. Some products are also used to treat acne in females. This medicine may be used for other purposes; ask your health care provider or pharmacist if you have questions. COMMON BRAND NAME(S): Blisovi 24 Fe, Blisovi Fe, Estrostep Fe, Gildess 24 Fe, Gildess Fe 1.5/30, Gildess Fe 1/20, Junel Fe 1.5/30, Junel Fe 1/20, Junel Fe 24, Larin Fe, Lo Loestrin Fe, Loestrin 24 Fe, Loestrin FE 1.5/30, Loestrin FE 1/20, Lomedia 24 Fe, Microgestin 24 Fe, Microgestin Fe 1.5/30, Microgestin Fe 1/20, Tarina Fe 1/20, Taytulla, Tilia Fe, Tri-Legest Fe What should I tell my health care provider before I take this medicine? They need to know if you have any of these conditions: -abnormal vaginal bleeding -blood vessel disease -breast, cervical, endometrial, ovarian, liver, or uterine cancer -diabetes -gallbladder disease -heart disease or recent heart attack -high blood pressure -high cholesterol -history of blood clots -kidney disease -liver disease -migraine headaches -smoke tobacco -stroke -systemic lupus erythematosus (SLE) -an unusual or allergic reaction to estrogens, progestins, other medicines, foods, dyes, or preservatives -pregnant or trying to get pregnant -breast-feeding How should I use this medicine? Take this medicine by mouth. To reduce nausea, this medicine may be taken with food. Follow the directions on the prescription label. Take this medicine at the same time each day and in the order directed on the package. Do not take your medicine more often than directed. A patient package insert for the product will be  given with each prescription and refill. Read this sheet carefully each time. The sheet may change frequently. Contact your pediatrician regarding the use of this medicine in children. Special care may be needed. This medicine has been used in female children who have started having menstrual periods. Overdosage: If you think you have taken too much of this medicine contact a poison control center or emergency room at once. NOTE: This medicine is only for you. Do not share this medicine with others. What if I miss a dose? If you miss a dose, refer to the patient information sheet you received with your medicine for direction. If you miss more than one pill, this medicine may not be as effective and you may need to use another form of birth control. What may interact with this medicine? Do not take this medicine with the following medication: -dasabuvir; ombitasvir; paritaprevir; ritonavir -ombitasvir; paritaprevir; ritonavir This medicine may also interact with the following medications: -acetaminophen -antibiotics or medicines for infections, especially rifampin, rifabutin, rifapentine, and griseofulvin, and possibly penicillins or tetracyclines -aprepitant -ascorbic acid (vitamin C) -atorvastatin -barbiturate medicines, such as phenobarbital -bosentan -carbamazepine -caffeine -clofibrate -cyclosporine -dantrolene -doxercalciferol -felbamate -grapefruit juice -hydrocortisone -medicines for anxiety or sleeping problems, such as diazepam or temazepam -medicines for diabetes, including pioglitazone -mineral oil -modafinil -mycophenolate -nefazodone -oxcarbazepine -phenytoin -prednisolone -ritonavir or other medicines for HIV infection or AIDS -rosuvastatin -selegiline -soy isoflavones supplements -St. John's wort -tamoxifen or raloxifene -theophylline -thyroid hormones -topiramate -warfarin This list may not describe all possible interactions. Give your health care  provider a list of all the medicines, herbs, non-prescription drugs, or dietary supplements you use. Also tell them if you smoke, drink alcohol, or use illegal drugs. Some   items may interact with your medicine. What should I watch for while using this medicine? Visit your doctor or health care professional for regular checks on your progress. You will need a regular breast and pelvic exam and Pap smear while on this medicine. Use an additional method of contraception during the first cycle that you take these tablets. If you have any reason to think you are pregnant, stop taking this medicine right away and contact your doctor or health care professional. If you are taking this medicine for hormone related problems, it may take several cycles of use to see improvement in your condition. Smoking increases the risk of getting a blood clot or having a stroke while you are taking birth control pills, especially if you are more than 32 years old. You are strongly advised not to smoke. This medicine can make your body retain fluid, making your fingers, hands, or ankles swell. Your blood pressure can go up. Contact your doctor or health care professional if you feel you are retaining fluid. This medicine can make you more sensitive to the sun. Keep out of the sun. If you cannot avoid being in the sun, wear protective clothing and use sunscreen. Do not use sun lamps or tanning beds/booths. If you wear contact lenses and notice visual changes, or if the lenses begin to feel uncomfortable, consult your eye care specialist. In some women, tenderness, swelling, or minor bleeding of the gums may occur. Notify your dentist if this happens. Brushing and flossing your teeth regularly may help limit this. See your dentist regularly and inform your dentist of the medicines you are taking. If you are going to have elective surgery, you may need to stop taking this medicine before the surgery. Consult your health care  professional for advice. This medicine does not protect you against HIV infection (AIDS) or any other sexually transmitted diseases. What side effects may I notice from receiving this medicine? Side effects that you should report to your doctor or health care professional as soon as possible: -allergic reactions like skin rash, itching or hives, swelling of the face, lips, or tongue -breast tissue changes or discharge -changes in vaginal bleeding during your period or between your periods -changes in vision -chest pain -confusion -coughing up blood -dizziness -feeling faint or lightheaded -headaches or migraines -leg, arm or groin pain -loss of balance or coordination -severe or sudden headaches -stomach pain (severe) -sudden shortness of breath -sudden numbness or weakness of the face, arm or leg -symptoms of vaginal infection like itching, irritation or unusual discharge -tenderness in the upper abdomen -trouble speaking or understanding -vomiting -yellowing of the eyes or skin Side effects that usually do not require medical attention (report to your doctor or health care professional if they continue or are bothersome): -breakthrough bleeding and spotting that continues beyond the 3 initial cycles of pills -breast tenderness -mood changes, anxiety, depression, frustration, anger, or emotional outbursts -increased sensitivity to sun or ultraviolet light -nausea -skin rash, acne, or brown spots on the skin -weight gain (slight) This list may not describe all possible side effects. Call your doctor for medical advice about side effects. You may report side effects to FDA at 1-800-FDA-1088. Where should I keep my medicine? Keep out of the reach of children. Store at room temperature between 15 and 30 degrees C (59 and 86 degrees F). Throw away any unused medicine after the expiration date. NOTE: This sheet is a summary. It may not cover all possible information. If you   have  questions about this medicine, talk to your doctor, pharmacist, or health care provider.  2018 Elsevier/Gold Standard (2016-08-15 08:04:41)  

## 2017-05-25 NOTE — Progress Notes (Signed)
Patient: Wendy Hunt Female    DOB: 02-Sep-1986   31 y.o.   MRN: [id number] Visit Date: 05/25/2017  Today's Provider: Margaretann LovelessJennifer M Burnette, PA-C   Chief Complaint  Patient presents with  . Follow-up   Subjective:    HPI  Follow up for   The patient was last seen for this 3 months ago. Changes made at last visit include no changes.  She reports excellent compliance with treatment. She feels that condition is Unchanged. Patient reports phentermine is not working for her like it has in the past.  She is not having side effects.   Wt Readings from Last 3 Encounters:  05/25/17 131 lb 3.2 oz (59.5 kg)  02/13/17 128 lb 9.6 oz (58.3 kg)  02/09/17 129 lb (58.5 kg)   Current Exercise Habits: Structured exercise class, Type of exercise: treadmill, Time (Minutes): 50, Frequency (Times/Week): 4, Weekly Exercise (Minutes/Week): 200, Intensity: Mild   ------------------------------------------------------------------------------------ Patient is also interested in starting OCP. She was on OCP through her teenage years from 2114-19. She was taken off through early 20s and had the nexplanon placed when she was 25. She kept this for 2 years and had removed, became pregnant, had implanon placed and subsequently removed when she was 29. She reports having regular menstrual cycles since coming off the implanon. She is interested in restarting the birth control pill since she has a new partner.     Allergies  Allergen Reactions  . Sulfa Antibiotics      Current Outpatient Prescriptions:  .  phentermine (ADIPEX-P) 37.5 MG tablet, Take 1 tablet (37.5 mg total) by mouth daily before breakfast., Disp: 30 tablet, Rfl: 0 .  nitrofurantoin, macrocrystal-monohydrate, (MACROBID) 100 MG capsule, Take 1 capsule (100 mg total) by mouth 2 (two) times daily. (Patient not taking: Reported on 05/25/2017), Disp: 14 capsule, Rfl: 0  Review of Systems  Constitutional: Positive for appetite change.    Respiratory: Negative.   Cardiovascular: Negative.   Gastrointestinal: Negative.   Allergic/Immunologic: Negative.     Social History  Substance Use Topics  . Smoking status: Former Smoker    Packs/day: 0.50    Years: 8.00    Quit date: 12/19/2011  . Smokeless tobacco: Never Used  . Alcohol use No   Objective:   BP 100/80 (BP Location: Left Arm, Patient Position: Sitting, Cuff Size: Normal)   Pulse 84   Temp 98.4 F (36.9 C) (Oral)   Resp 16   Ht 4\' 11"  (1.499 m)   Wt 131 lb 3.2 oz (59.5 kg)   LMP 05/19/2017 (Approximate)   SpO2 98%   BMI 26.50 kg/m  Vitals:   05/25/17 1552  BP: 100/80  Pulse: 84  Resp: 16  Temp: 98.4 F (36.9 C)  TempSrc: Oral  SpO2: 98%  Weight: 131 lb 3.2 oz (59.5 kg)  Height: 4\' 11"  (1.499 m)     Physical Exam  Constitutional: She appears well-developed and well-nourished. No distress.  Neck: Normal range of motion. Neck supple.  Cardiovascular: Normal rate, regular rhythm and normal heart sounds.  Exam reveals no gallop and no friction rub.   No murmur heard. Pulmonary/Chest: Effort normal and breath sounds normal. No respiratory distress. She has no wheezes. She has no rales.  Skin: She is not diaphoretic.  Vitals reviewed.      Assessment & Plan:     1. Weight loss counseling, encounter for Continue dieting and exercise. Will give phentermine as below.  - phentermine 37.5  MG capsule; Take 1 capsule (37.5 mg total) by mouth every morning.  Dispense: 30 capsule; Refill: 0  2. BMI 26.0-26.9,adult - phentermine 37.5 MG capsule; Take 1 capsule (37.5 mg total) by mouth every morning.  Dispense: 30 capsule; Refill: 0  3. Encounter for initial prescription of contraceptive pills Will restart OCPs as below. She is to call if she has adverse effects. Advised to make sure to not smoke.  - norethindrone-ethinyl estradiol (MICROGESTIN,JUNEL,LOESTRIN) 1-20 MG-MCG tablet; Take 1 tablet by mouth daily.  Dispense: 1 Package; Refill: 11        Margaretann Loveless, PA-C  [facility]

## 2017-06-07 ENCOUNTER — Encounter: Payer: Self-pay | Admitting: Physician Assistant

## 2017-06-07 ENCOUNTER — Ambulatory Visit (INDEPENDENT_AMBULATORY_CARE_PROVIDER_SITE_OTHER): Payer: 59 | Admitting: Physician Assistant

## 2017-06-07 VITALS — BP 104/62 | HR 110 | Temp 99.0°F | Resp 16 | Wt 130.0 lb

## 2017-06-07 DIAGNOSIS — J029 Acute pharyngitis, unspecified: Secondary | ICD-10-CM

## 2017-06-07 DIAGNOSIS — J020 Streptococcal pharyngitis: Secondary | ICD-10-CM

## 2017-06-07 LAB — POCT RAPID STREP A (OFFICE): Rapid Strep A Screen: POSITIVE — AB

## 2017-06-07 MED ORDER — AMOXICILLIN 875 MG PO TABS: 875.0000 mg | ORAL_TABLET | Freq: Two times a day (BID) | ORAL | 0 refills | Status: DC

## 2017-06-07 NOTE — Patient Instructions (Signed)
Strep Throat Strep throat is a bacterial infection of the throat. Your health care provider may call the infection tonsillitis or pharyngitis, depending on whether there is swelling in the tonsils or at the back of the throat. Strep throat is most common during the cold months of the year in children who are 5-32 years of age, but it can happen during any season in people of any age. This infection is spread from person to person (contagious) through coughing, sneezing, or close contact. What are the causes? Strep throat is caused by the bacteria called Streptococcus pyogenes. What increases the risk? This condition is more likely to develop in:  People who spend time in crowded places where the infection can spread easily.  People who have close contact with someone who has strep throat.  What are the signs or symptoms? Symptoms of this condition include:  Fever or chills.  Redness, swelling, or pain in the tonsils or throat.  Pain or difficulty when swallowing.  White or yellow spots on the tonsils or throat.  Swollen, tender glands in the neck or under the jaw.  Red rash all over the body (rare).  How is this diagnosed? This condition is diagnosed by performing a rapid strep test or by taking a swab of your throat (throat culture test). Results from a rapid strep test are usually ready in a few minutes, but throat culture test results are available after one or two days. How is this treated? This condition is treated with antibiotic medicine. Follow these instructions at home: Medicines  Take over-the-counter and prescription medicines only as told by your health care provider.  Take your antibiotic as told by your health care provider. Do not stop taking the antibiotic even if you start to feel better.  Have family members who also have a sore throat or fever tested for strep throat. They may need antibiotics if they have the strep infection. Eating and drinking  Do not  share food, drinking cups, or personal items that could cause the infection to spread to other people.  If swallowing is difficult, try eating soft foods until your sore throat feels better.  Drink enough fluid to keep your urine clear or pale yellow. General instructions  Gargle with a salt-water mixture 3-4 times per day or as needed. To make a salt-water mixture, completely dissolve -1 tsp of salt in 1 cup of warm water.  Make sure that all household members wash their hands well.  Get plenty of rest.  Stay home from school or work until you have been taking antibiotics for 24 hours.  Keep all follow-up visits as told by your health care provider. This is important. Contact a health care provider if:  The glands in your neck continue to get bigger.  You develop a rash, cough, or earache.  You cough up a thick liquid that is green, yellow-brown, or bloody.  You have pain or discomfort that does not get better with medicine.  Your problems seem to be getting worse rather than better.  You have a fever. Get help right away if:  You have new symptoms, such as vomiting, severe headache, stiff or painful neck, chest pain, or shortness of breath.  You have severe throat pain, drooling, or changes in your voice.  You have swelling of the neck, or the skin on the neck becomes red and tender.  You have signs of dehydration, such as fatigue, dry mouth, and decreased urination.  You become increasingly sleepy, or   you cannot wake up completely.  Your joints become red or painful. This information is not intended to replace advice given to you by your health care provider. Make sure you discuss any questions you have with your health care provider. Document Released: 12/02/2000 Document Revised: 08/03/2016 Document Reviewed: 03/30/2015 Elsevier Interactive Patient Education  2017 Elsevier Inc.  

## 2017-06-07 NOTE — Progress Notes (Signed)
Patient: Wendy Hunt Female    DOB: 08/25/1986   31 y.o.   MRN: [id number] Visit Date: 06/07/2017  Today's Provider: Margaretann Loveless, PA-C   Chief Complaint  Patient presents with  . Sore Throat   Subjective:    Sore Throat   This is a new problem. The current episode started yesterday. The problem has been gradually worsening. There has been no fever (has felt feverish). The pain is mild. Associated symptoms include congestion, ear pain, headaches, a hoarse voice, swollen glands and trouble swallowing. She has had no exposure to strep. She has tried NSAIDs for the symptoms. The treatment provided mild relief.  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     Allergies  Allergen Reactions  . Sulfa Antibiotics      Current Outpatient Prescriptions:  .  norethindrone-ethinyl estradiol (MICROGESTIN,JUNEL,LOESTRIN) 1-20 MG-MCG tablet, Take 1 tablet by mouth daily., Disp: 1 Package, Rfl: 11 .  phentermine 37.5 MG capsule, Take 1 capsule (37.5 mg total) by mouth every morning., Disp: 30 capsule, Rfl: 0  Review of Systems  Constitutional: Negative.   HENT: Positive for congestion, ear pain, hoarse voice, postnasal drip, sore throat and trouble swallowing.   Respiratory: Negative.   Cardiovascular: Negative.   Gastrointestinal: Negative.   Neurological: Positive for headaches. Negative for dizziness.    Social History  Substance Use Topics  . Smoking status: Former Smoker    Packs/day: 0.50    Years: 8.00    Quit date: 12/19/2011  . Smokeless tobacco: Never Used  . Alcohol use No   Objective:   BP 104/62 (BP Location: Right Arm, Patient Position: Sitting, Cuff Size: Normal)   Pulse (!) 110   Temp 99 F (37.2 C)   Resp 16   Wt 130 lb (59 kg)   LMP 05/19/2017 (Approximate)   SpO2 99%   BMI 26.26 kg/m  Vitals:   06/07/17 1136  BP: 104/62  Pulse: (!) 110  Resp: 16  Temp: 99 F (37.2 C)  SpO2: 99%    Weight: 130 lb (59 kg)     Physical Exam  Constitutional: She appears well-developed and well-nourished. No distress.  HENT:  Head: Normocephalic and atraumatic.  Right Ear: Hearing, tympanic membrane, external ear and ear canal normal.  Left Ear: Hearing, tympanic membrane, external ear and ear canal normal.  Nose: Nose normal.  Mouth/Throat: Uvula is midline and mucous membranes are normal. Oropharyngeal exudate, posterior oropharyngeal edema and posterior oropharyngeal erythema present.  Eyes: Conjunctivae are normal. Pupils are equal, round, and reactive to light. Right eye exhibits no discharge. Left eye exhibits no discharge. No scleral icterus.  Neck: Normal range of motion. Neck supple. No tracheal deviation present. No thyromegaly present.  Cardiovascular: Normal rate, regular rhythm and normal heart sounds.  Exam reveals no gallop and no friction rub.   No murmur heard. Pulmonary/Chest: Effort normal and breath sounds normal. No stridor. No respiratory distress. She has no wheezes. She has no rales.  Lymphadenopathy:    She has no cervical adenopathy.  Skin: Skin is warm and dry. She is not diaphoretic.  Vitals reviewed.       Assessment & Plan:     1. Sore throat Rapid strep positive for strep A. - POCT rapid strep A  2. Strep pharyngitis Will treat with amoxicillin as below for positive strep. Work note given. Tylenol prn. Salt water gargles and chloraseptic spray for  throat pain. She is to call if no improvement.  - amoxicillin (AMOXIL) 875 MG tablet; Take 1 tablet (875 mg total) by mouth 2 (two) times daily.  Dispense: 20 tablet; Refill: 0       Margaretann LovelessJennifer M Manasvi Dickard, PA-C  [facility]

## 2017-08-31 ENCOUNTER — Ambulatory Visit (INDEPENDENT_AMBULATORY_CARE_PROVIDER_SITE_OTHER): Payer: 59 | Admitting: Physician Assistant

## 2017-08-31 ENCOUNTER — Encounter: Payer: Self-pay | Admitting: Physician Assistant

## 2017-08-31 DIAGNOSIS — Z713 Dietary counseling and surveillance: Secondary | ICD-10-CM

## 2017-08-31 DIAGNOSIS — Z6824 Body mass index (BMI) 24.0-24.9, adult: Secondary | ICD-10-CM

## 2017-08-31 MED ORDER — PHENTERMINE HCL 37.5 MG PO CAPS: 37.5000 mg | ORAL_CAPSULE | ORAL | 0 refills | Status: DC

## 2017-08-31 NOTE — Progress Notes (Signed)
       Patient: Wendy Hunt Female    DOB: September 14, 1986   31 y.o.   MRN: [id number] Visit Date: 08/31/2017  Today's Provider: Margaretann LovelessJennifer M Cecille Mcclusky, PA-C   Chief Complaint  Patient presents with  . Follow-up    weight loss   Subjective:    HPI  Follow up for weight loss  The patient was last seen for this 3 months ago. Changes made at last visit include no changes.  She reports excellent compliance with treatment. She feels that condition is Improved. She is not having side effects.  ------------------------------------------------------------------------------------     Allergies  Allergen Reactions  . Sulfa Antibiotics      Current Outpatient Prescriptions:  .  phentermine 37.5 MG capsule, Take 1 capsule (37.5 mg total) by mouth every morning., Disp: 30 capsule, Rfl: 0  Review of Systems  Constitutional: Negative.   HENT: Negative.   Respiratory: Negative.   Cardiovascular: Negative.   Gastrointestinal: Negative.   Psychiatric/Behavioral: Negative.     Social History  Substance Use Topics  . Smoking status: Former Smoker    Packs/day: 0.50    Years: 8.00    Quit date: 12/19/2011  . Smokeless tobacco: Never Used  . Alcohol use No   Objective:   BP 110/70 (BP Location: Left Arm, Patient Position: Sitting, Cuff Size: Normal)   Pulse (!) 112   Temp 98.4 F (36.9 C) (Oral)   Resp 16   Ht 5' (1.524 m)   Wt 124 lb 3.2 oz (56.3 kg)   SpO2 96%   BMI 24.26 kg/m  Vitals:   08/31/17 1558  BP: 110/70  Pulse: (!) 112  Resp: 16  Temp: 98.4 F (36.9 C)  TempSrc: Oral  SpO2: 96%  Weight: 124 lb 3.2 oz (56.3 kg)  Height: 5' (1.524 m)     Physical Exam  Constitutional: She appears well-developed and well-nourished. No distress.  Neck: Normal range of motion. Neck supple. No tracheal deviation present. No thyromegaly present.  Cardiovascular: Normal rate, regular rhythm and normal heart sounds.  Exam reveals no gallop and no friction rub.   No murmur  heard. Pulmonary/Chest: Effort normal and breath sounds normal. No respiratory distress. She has no wheezes. She has no rales.  Lymphadenopathy:    She has no cervical adenopathy.  Skin: She is not diaphoretic.  Psychiatric: She has a normal mood and affect. Her behavior is normal. Judgment and thought content normal.  Vitals reviewed.      Assessment & Plan:     1. Weight loss counseling, encounter for Doing well. Will continue phentermine for one more month to maintain her weight loss that she has had thus far.  - phentermine 37.5 MG capsule; Take 1 capsule (37.5 mg total) by mouth every morning.  Dispense: 30 capsule; Refill: 0  2. BMI 24.0-24.9,adult See above medical treatment plan. - phentermine 37.5 MG capsule; Take 1 capsule (37.5 mg total) by mouth every morning.  Dispense: 30 capsule; Refill: 0       Margaretann LovelessJennifer M Dalexa Gentz, PA-C  [facility]

## 2017-08-31 NOTE — Patient Instructions (Addendum)

## 2017-09-14 ENCOUNTER — Ambulatory Visit: Payer: 59 | Admitting: Physician Assistant

## 2017-10-20 ENCOUNTER — Encounter: Payer: Self-pay | Admitting: Physician Assistant

## 2017-10-20 ENCOUNTER — Other Ambulatory Visit: Payer: Self-pay | Admitting: Physician Assistant

## 2017-10-20 ENCOUNTER — Ambulatory Visit (INDEPENDENT_AMBULATORY_CARE_PROVIDER_SITE_OTHER): Payer: 59 | Admitting: Physician Assistant

## 2017-10-20 VITALS — BP 112/72 | HR 88 | Temp 98.6°F | Resp 16 | Wt 125.0 lb

## 2017-10-20 DIAGNOSIS — K121 Other forms of stomatitis: Secondary | ICD-10-CM

## 2017-10-20 DIAGNOSIS — Z23 Encounter for immunization: Secondary | ICD-10-CM | POA: Diagnosis not present

## 2017-10-20 DIAGNOSIS — Z7251 High risk heterosexual behavior: Secondary | ICD-10-CM | POA: Diagnosis not present

## 2017-10-20 NOTE — Progress Notes (Signed)
Patient: Wendy Hunt Female    DOB: May 31, 1986   31 y.o.   MRN: [id number] Visit Date: 10/20/2017  Today's Provider: Trey SailorsAdriana M Pollak, PA-C   Chief Complaint  Patient presents with  . Exposure to STD   Subjective:      Wendy Hunt is a 31 y/o woman with history of unprotected sex 5 weeks ago presenting to clinic today requesting any STI testing that can be done by blood. Also complains of two mouth ulcers that burn and have been present several days. Says she may feel lymph nodes in her neck.   Exposure to STD   The patient's pertinent negatives include no discharge, dyspareunia, dysuria, genital itching, genital lesions, genital rash or pelvic pain. Pertinent negatives include no abdominal pain, anorexia, diaphoresis, fever, genital odor, rectal pain, sore throat or urinary frequency.       Allergies  Allergen Reactions  . Sulfa Antibiotics      Current Outpatient Prescriptions:  .  phentermine 37.5 MG capsule, Take 1 capsule (37.5 mg total) by mouth every morning., Disp: 30 capsule, Rfl: 0  Review of Systems  Constitutional: Negative.  Negative for diaphoresis and fever.  HENT: Positive for mouth sores (Started about two days ago.). Negative for congestion, postnasal drip, rhinorrhea, sinus pain, sinus pressure, sneezing, sore throat, tinnitus and trouble swallowing.   Respiratory: Negative.   Cardiovascular: Negative.   Gastrointestinal: Negative.  Negative for abdominal pain, anorexia and rectal pain.  Genitourinary: Negative.  Negative for dyspareunia, dysuria, frequency and pelvic pain.  Hematological: Positive for adenopathy (Right sided lymphnode. ).    Social History  Substance Use Topics  . Smoking status: Former Smoker    Packs/day: 0.50    Years: 8.00    Quit date: 12/19/2011  . Smokeless tobacco: Never Used  . Alcohol use No   Objective:   BP 112/72 (BP Location: Left Arm, Patient Position: Sitting, Cuff Size: Normal)   Pulse 88   Temp  98.6 F (37 C) (Oral)   Resp 16   Wt 125 lb (56.7 kg)   LMP 09/18/2017   BMI 24.41 kg/m  Vitals:   10/20/17 1608  BP: 112/72  Pulse: 88  Resp: 16  Temp: 98.6 F (37 C)  TempSrc: Oral  Weight: 125 lb (56.7 kg)     Physical Exam  Constitutional: She is oriented to person, place, and time. She appears well-developed and well-nourished.  HENT:  Mouth/Throat: Oropharynx is clear and moist. No oropharyngeal exudate.  One small ulcerative lesion along frenulum, and another along left cheek. Swabbed for HSV.   Neck: Neck supple.  Pulmonary/Chest: Effort normal and breath sounds normal.  Lymphadenopathy:    She has no cervical adenopathy.  Neurological: She is alert and oriented to person, place, and time.  Skin: Skin is warm and dry.  Psychiatric: She has a normal mood and affect. Her behavior is normal.        Assessment & Plan:     1. Mouth ulcer  - Herpes simplex virus culture  2. Unprotected sex  - HIV antibody (with reflex) - RPR - Hepatitis C Antibody - HSV(herpes simplex vrs) 1+2 ab-IgG  3. Influenza vaccination administered at current visit  - Flu Vaccine QUAD 6+ mos PF IM (Fluarix Quad PF)  Return if symptoms worsen or fail to improve.  The entirety of the information documented in the History of Present Illness, Review of Systems and Physical Exam were personally obtained by me.  Portions of this information were initially documented by Ashley Royalty, CMA and reviewed by me for thoroughness and accuracy.        Trinna Post, PA-C  [facility]

## 2017-10-20 NOTE — Patient Instructions (Signed)

## 2017-10-23 ENCOUNTER — Telehealth: Payer: Self-pay

## 2017-10-23 LAB — HERPES SIMPLEX VIRUS CULTURE
MICRO NUMBER:: 81233799
SPECIMEN QUALITY:: ADEQUATE

## 2017-10-23 LAB — HIV ANTIBODY (ROUTINE TESTING W REFLEX): HIV 1&2 Ab, 4th Generation: NONREACTIVE

## 2017-10-23 LAB — RPR: RPR Ser Ql: NONREACTIVE

## 2017-10-23 LAB — HEPATITIS C ANTIBODY
Hepatitis C Ab: NONREACTIVE
SIGNAL TO CUT-OFF: 0.01 (ref <1.00)

## 2017-10-23 LAB — HSV(HERPES SIMPLEX VRS) I + II AB-IGG
HAV 1 IGG,TYPE SPECIFIC AB: <0.9 index
HSV 2 IGG,TYPE SPECIFIC AB: <0.9 index

## 2017-10-23 NOTE — Telephone Encounter (Signed)
Pt advised.   Thanks,   -Ichael Pullara  

## 2017-10-23 NOTE — Telephone Encounter (Signed)
-----   Message from Trey SailorsAdriana M Pollak, [place]-C sent at 10/23/2017  2:27 PM EST ----- All STI testing negative. Swab did not reveal HSV.

## 2017-10-23 NOTE — Telephone Encounter (Signed)
-----   Message from Trey SailorsAdriana M Pollak, [place]-C sent at 10/23/2017  2:27 PM EST ----- Swab did not reveal HSV from mouth ulcer. Would try some warm salt water gargles to see if this brings about improvement.

## 2017-12-29 ENCOUNTER — Encounter: Payer: Self-pay | Admitting: Physician Assistant

## 2017-12-29 ENCOUNTER — Ambulatory Visit: Payer: 59 | Admitting: Physician Assistant

## 2017-12-29 DIAGNOSIS — Z713 Dietary counseling and surveillance: Secondary | ICD-10-CM | POA: Diagnosis not present

## 2017-12-29 DIAGNOSIS — Z6826 Body mass index (BMI) 26.0-26.9, adult: Secondary | ICD-10-CM | POA: Diagnosis not present

## 2017-12-29 MED ORDER — PHENTERMINE HCL 37.5 MG PO CAPS: 37.5000 mg | ORAL_CAPSULE | ORAL | 0 refills | Status: DC

## 2017-12-29 NOTE — Progress Notes (Signed)
       Patient: Wendy BackboneKathleen E Ward Female    DOB: [birth year]   32 y.o.   MRN: [id number] Visit Date: 12/29/2017  Today's Provider: Margaretann LovelessJennifer M Haddy Mullinax, PA-C   Chief Complaint  Patient presents with  . Follow-up    Weight Loss Counseling   Subjective:    HPI    Weight Loss, Follow up:  The patient was last seen for Weight 3 1/2 months ago. Changes made since that visit include none.Continue Phentermine for one month.  She reports fair compliance with treatment. She is not having side effects. Per patient she still has like 8 pills left. She reports that she started the Keto diet today. ------------------------------------------------------------------------    Allergies  Allergen Reactions  . Sulfa Antibiotics      Current Outpatient Medications:  .  phentermine 37.5 MG capsule, Take 1 capsule (37.5 mg total) by mouth every morning. (Patient not taking: Reported on 12/29/2017), Disp: 30 capsule, Rfl: 0  Review of Systems  Constitutional: Negative.   Respiratory: Negative.   Cardiovascular: Negative.   Psychiatric/Behavioral: Negative.     Social History   Tobacco Use  . Smoking status: Former Smoker    Packs/day: 0.50    Years: 8.00    Pack years: 4.00    Last attempt to quit: 12/19/2011    Years since quitting: 6.0  . Smokeless tobacco: Never Used  Substance Use Topics  . Alcohol use: No   Objective:   BP 110/60 (BP Location: Left Arm, Patient Position: Sitting, Cuff Size: Normal)   Pulse (!) 108   Temp 98.6 F (37 C) (Oral)   Resp 16   Wt 135 lb (61.2 kg)   SpO2 99%   BMI 26.37 kg/m     Physical Exam  Constitutional: She appears well-developed and well-nourished. No distress.  Neck: Normal range of motion. Neck supple.  Cardiovascular: Normal rate, regular rhythm and normal heart sounds. Exam reveals no gallop and no friction rub.  No murmur heard. Pulmonary/Chest: Effort normal and breath sounds normal. No respiratory distress. She has no  wheezes. She has no rales.  Skin: She is not diaphoretic.  Vitals reviewed.      Assessment & Plan:     1. Weight loss counseling, encounter for Patient is starting keto. Discussed in detail about carb counting and may want to stay around 30-45 gram net carbs. She is in agreement. Phentermine given to help with appetite during starting diet.  - phentermine 37.5 MG capsule; Take 1 capsule (37.5 mg total) by mouth every morning.  Dispense: 30 capsule; Refill: 0  2. BMI 26.0-26.9,adult See above medical treatment plan. - phentermine 37.5 MG capsule; Take 1 capsule (37.5 mg total) by mouth every morning.  Dispense: 30 capsule; Refill: 0       Margaretann LovelessJennifer M Anysia Choi, PA-C  [facility]

## 2018-03-23 ENCOUNTER — Ambulatory Visit: Payer: 59 | Admitting: Family Medicine

## 2018-03-23 ENCOUNTER — Encounter: Payer: Self-pay | Admitting: Family Medicine

## 2018-03-23 ENCOUNTER — Ambulatory Visit: Payer: Self-pay | Admitting: Physician Assistant

## 2018-03-23 VITALS — BP 110/70 | HR 86 | Temp 98.3°F | Resp 16 | Wt 128.0 lb

## 2018-03-23 DIAGNOSIS — N939 Abnormal uterine and vaginal bleeding, unspecified: Secondary | ICD-10-CM

## 2018-03-23 LAB — POCT URINALYSIS DIPSTICK
Appearance: NORMAL
Bilirubin, UA: NEGATIVE
Glucose, UA: NEGATIVE
KETONES UA: NEGATIVE
Leukocytes, UA: NEGATIVE
NITRITE UA: NEGATIVE
Odor: NORMAL
PROTEIN UA: NEGATIVE
SPEC GRAV UA: 1.01 (ref 1.010–1.025)
Urobilinogen, UA: 0.2 E.U./dL
pH, UA: 6.5 (ref 5.0–8.0)

## 2018-03-23 LAB — POCT URINE PREGNANCY: PREG TEST UR: NEGATIVE

## 2018-03-23 NOTE — Progress Notes (Addendum)
Patient: Wendy Hunt Female    DOB: [birth year]   31 y.o.   MRN: [id number] Visit Date: 03/23/2018  Today's Provider: Mila Merryonald Fisher, MD   No chief complaint on file.  Subjective:     This former patient of Dr. Santiago BurMaloney's who is now followed mainly by Daiva NakayamaJenni Burnette presents as a new patient to me today.  Patient states that in between her periods she has been having 1-2 weeks where she will have a heavy discharge/spotting. Occasional cramping. Patient states her periods are regular. Discharge/spotting as been happening for several months.  She is not on hormonal or other forms of contraception. No change in diet, no new vitamins or supplements, or other new medications.     Allergies  Allergen Reactions  . Sulfa Antibiotics      Current Outpatient Medications:  .  phentermine 37.5 MG capsule, Take 1 capsule (37.5 mg total) by mouth every morning. (Patient not taking: Reported on 03/23/2018), Disp: 30 capsule, Rfl: 0  Review of Systems  Constitutional: Negative for appetite change, chills, fatigue and fever.  Respiratory: Negative for chest tightness and shortness of breath.   Cardiovascular: Negative for chest pain and palpitations.  Gastrointestinal: Negative for abdominal pain, nausea and vomiting.  Genitourinary: Positive for menstrual problem, vaginal bleeding and vaginal discharge.  Neurological: Negative for dizziness and weakness.   OB History    Gravida  1   Para  1   Term      Preterm      AB      Living        SAB      TAB      Ectopic      Multiple      Live Births             Past Surgical History:  Procedure Laterality Date  . CESAREAN SECTION  November 2013  . WISDOM TOOTH EXTRACTION  2003    Social History   Tobacco Use  . Smoking status: Former Smoker    Packs/day: 0.50    Years: 8.00    Pack years: 4.00    Last attempt to quit: 12/19/2011    Years since quitting: 6.2  . Smokeless tobacco: Never Used  Substance Use  Topics  . Alcohol use: No   Objective:   BP 110/70 (BP Location: Right Arm, Patient Position: Sitting, Cuff Size: Normal)   Pulse 86   Temp 98.3 F (36.8 C) (Oral)   Resp 16   Wt 128 lb (58.1 kg)   LMP 03/08/2018 (Approximate)   SpO2 95%   BMI 25.00 kg/m  Vitals:   03/23/18 1502  BP: 110/70  Pulse: 86  Resp: 16  Temp: 98.3 F (36.8 C)  TempSrc: Oral  SpO2: 95%  Weight: 128 lb (58.1 kg)     Physical Exam  General Appearance:    Alert, cooperative, no distress  Eyes:    PERRL, conjunctiva/corneas clear, EOM's intact       Lungs:     Clear to auscultation bilaterally, respirations unlabored  Heart:    Regular rate and rhythm  Abdomen:   . No CVA tenderness    Results for orders placed or performed in visit on 03/23/18  Result Value Ref Range  POCT urine pregnancy  Result Value Ref Range   Preg Test, Ur Negative Negative       Assessment & Plan:     1. Abnormal uterine bleeding  - POCT  urinalysis dipstick - hCG, serum, qualitative - Comprehensive metabolic panel - CBC - TSH - FSH/LH - POCT urine pregnancy - US Transvaginal Non-OB; Future - US Pelvis Complete  Consider gyn referral      Mila Merry, MD  [facility]

## 2018-03-24 LAB — CBC
HEMATOCRIT: 41.9 % (ref 34.0–46.6)
Hemoglobin: 14.1 g/dL (ref 11.1–15.9)
MCH: 28.6 pg (ref 26.6–33.0)
MCHC: 33.7 g/dL (ref 31.5–35.7)
MCV: 85 fL (ref 79–97)
Platelets: 277 10*3/uL (ref 150–379)
RBC: 4.93 x10E6/uL (ref 3.77–5.28)
RDW: 13 % (ref 12.3–15.4)
WBC: 7.4 10*3/uL (ref 3.4–10.8)

## 2018-03-24 LAB — COMPREHENSIVE METABOLIC PANEL
A/G RATIO: 1.8 (ref 1.2–2.2)
ALBUMIN: 4.8 g/dL (ref 3.5–5.5)
ALK PHOS: 47 IU/L (ref 39–117)
ALT: 16 IU/L (ref 0–32)
AST: 17 IU/L (ref 0–40)
BUN / CREAT RATIO: 17 (ref 9–23)
BUN: 14 mg/dL (ref 6–20)
Bilirubin Total: 0.5 mg/dL (ref 0.0–1.2)
CO2: 22 mmol/L (ref 20–29)
CREATININE: 0.83 mg/dL (ref 0.57–1.00)
Calcium: 9.3 mg/dL (ref 8.7–10.2)
Chloride: 102 mmol/L (ref 96–106)
GFR calc Af Amer: 108 mL/min/{1.73_m2} (ref >59)
GFR, EST NON AFRICAN AMERICAN: 94 mL/min/{1.73_m2} (ref >59)
GLOBULIN, TOTAL: 2.6 g/dL (ref 1.5–4.5)
Glucose: 84 mg/dL (ref 65–99)
POTASSIUM: 4 mmol/L (ref 3.5–5.2)
Sodium: 141 mmol/L (ref 134–144)
Total Protein: 7.4 g/dL (ref 6.0–8.5)

## 2018-03-24 LAB — FSH/LH
FSH: 5.5 m[IU]/mL
LH: 12.3 m[IU]/mL

## 2018-03-24 LAB — HCG, SERUM, QUALITATIVE: hCG,Beta Subunit,Qual,Serum: NEGATIVE m[IU]/mL (ref <6)

## 2018-03-24 LAB — TSH: TSH: 1.11 u[IU]/mL (ref 0.450–4.500)

## 2018-03-28 ENCOUNTER — Telehealth: Payer: Self-pay | Admitting: Family Medicine

## 2018-03-28 NOTE — Telephone Encounter (Signed)
Pt called back requesting a call back for her lab results. Pt stated that she was advised that her labs would be back on Monday and today is Wednesday. Pt stated that she can't have her phone at work and is requesting that her results be left on her voicemail if she isn't able to answer. Please advise. Thanks TNP

## 2018-03-28 NOTE — Telephone Encounter (Signed)
See result note.  

## 2018-03-28 NOTE — Telephone Encounter (Signed)
Left detailed message on pt's vm. Okay per patient.

## 2018-03-28 NOTE — Telephone Encounter (Signed)
Patient wants results from labs on 03/23/18.

## 2018-03-28 NOTE — Telephone Encounter (Signed)
Please advise results? 

## 2018-03-30 ENCOUNTER — Ambulatory Visit: Payer: 59

## 2018-03-30 ENCOUNTER — Ambulatory Visit
Admission: RE | Admit: 2018-03-30 | Discharge: 2018-03-30 | Disposition: A | Discharge status: Home / Self Care | Payer: 59 | Source: Ambulatory Visit | Attending: Family Medicine | Admitting: Family Medicine

## 2018-03-30 DIAGNOSIS — N939 Abnormal uterine and vaginal bleeding, unspecified: Secondary | ICD-10-CM | POA: Diagnosis present

## 2018-04-02 ENCOUNTER — Telehealth: Payer: Self-pay | Admitting: *Deleted

## 2018-04-02 DIAGNOSIS — N939 Abnormal uterine and vaginal bleeding, unspecified: Secondary | ICD-10-CM

## 2018-04-02 NOTE — Telephone Encounter (Signed)
Pt advised. Referral placed.  

## 2018-04-02 NOTE — Telephone Encounter (Signed)
LMOVM for pt to return call 

## 2018-04-02 NOTE — Telephone Encounter (Signed)
-----   Message from Malva Limesonald E Fisher, MD sent at 04/01/2018  8:17 PM EDT ----- Ultrasound is normal, no fibroids seen. Need referral to Gyn for evaluation of bleeding.

## 2018-05-10 ENCOUNTER — Telehealth: Payer: Self-pay | Admitting: Family Medicine

## 2018-05-10 NOTE — Telephone Encounter (Signed)
Please check with patient regarding gyn referral for abnormal uterine bleeding. She was referred to Dr. Feliberto Gottron, but we haven't gotten any records that she was seen. Does she need [place] to redo referral?

## 2018-05-10 NOTE — Telephone Encounter (Signed)
Patient stated she wants to cancel referral for now. Patient her symptoms have leveled off and she doesn't feel that she needs to see gyn at this time. Patient stated it symptoms return she will contact us for referral.

## 2018-06-12 ENCOUNTER — Ambulatory Visit: Payer: Self-pay | Admitting: Physician Assistant

## 2018-07-05 ENCOUNTER — Encounter: Payer: Self-pay | Admitting: Physician Assistant

## 2018-07-05 ENCOUNTER — Ambulatory Visit: Payer: 59 | Admitting: Physician Assistant

## 2018-07-05 VITALS — BP 108/70 | HR 95 | Temp 98.6°F | Resp 16 | Ht 60.0 in | Wt 142.0 lb

## 2018-07-05 DIAGNOSIS — Z6826 Body mass index (BMI) 26.0-26.9, adult: Secondary | ICD-10-CM | POA: Diagnosis not present

## 2018-07-05 DIAGNOSIS — M7121 Synovial cyst of popliteal space [Baker], right knee: Secondary | ICD-10-CM

## 2018-07-05 DIAGNOSIS — Z713 Dietary counseling and surveillance: Secondary | ICD-10-CM | POA: Diagnosis not present

## 2018-07-05 MED ORDER — PHENTERMINE HCL 37.5 MG PO CAPS: 37.5000 mg | ORAL_CAPSULE | ORAL | 2 refills | Status: DC

## 2018-07-05 NOTE — Progress Notes (Signed)
Patient: Wendy Hunt Female    DOB: 11/15/1986   32 y.o.   MRN: [id number] Visit Date: 07/05/2018  Today's Provider: Margaretann LovelessJennifer M Kahli Mayon, PA-C   Chief Complaint  Patient presents with  . Obesity   Subjective:    HPI Patient here today to get back on Phentermine. Patient reports that she did the low carb and had some stressors go on and she is a stress eater. She also reports that she was put on high dose hormones and she gain 7 pounds within a week. She also reports that she is getting married in November. Patient reports exercising.   Patient with c/o swelling behind the right knee. No injury,no pain.    Allergies  Allergen Reactions  . Sulfa Antibiotics      Current Outpatient Medications:  .  phentermine 37.5 MG capsule, Take 1 capsule (37.5 mg total) by mouth every morning. (Patient not taking: Reported on 03/23/2018), Disp: 30 capsule, Rfl: 0  Review of Systems  Constitutional: Negative.   Respiratory: Negative.   Cardiovascular: Negative.   Musculoskeletal: Negative.   Neurological: Negative.     Social History   Tobacco Use  . Smoking status: Former Smoker    Packs/day: 0.50    Years: 8.00    Pack years: 4.00    Last attempt to quit: 12/19/2011    Years since quitting: 6.5  . Smokeless tobacco: Never Used  Substance Use Topics  . Alcohol use: No   Objective:   BP 108/70 (BP Location: Left Arm, Patient Position: Sitting, Cuff Size: Normal)   Pulse 95   Temp 98.6 F (37 C) (Oral)   Resp 16   Ht 5' (1.524 m)   Wt 142 lb (64.4 kg)   LMP 06/18/2018   SpO2 98%   BMI 27.73 kg/m  Vitals:   07/05/18 1440  BP: 108/70  Pulse: 95  Resp: 16  Temp: 98.6 F (37 C)  TempSrc: Oral  SpO2: 98%  Weight: 142 lb (64.4 kg)  Height: 5' (1.524 m)     Physical Exam  Constitutional: She appears well-developed and well-nourished. No distress.  Neck: Normal range of motion. Neck supple.  Cardiovascular: Normal rate, regular rhythm and normal heart  sounds. Exam reveals no gallop and no friction rub.  No murmur heard. Pulmonary/Chest: Effort normal and breath sounds normal. No respiratory distress. She has no wheezes. She has no rales.  Musculoskeletal:       Right knee: She exhibits swelling (posterior). She exhibits normal range of motion, no effusion, no erythema, normal alignment, no LCL laxity, normal patellar mobility, no bony tenderness, normal meniscus and no MCL laxity. No tenderness found.       Legs: Skin: She is not diaphoretic.  Vitals reviewed.      Assessment & Plan:     1. Weight loss counseling, encounter for Will restart phentermine as below. Continue food diary and exercise.  - phentermine 37.5 MG capsule; Take 1 capsule (37.5 mg total) by mouth every morning.  Dispense: 30 capsule; Refill: 2  2. BMI 26.0-26.9,adult See above medical treatment plan. - phentermine 37.5 MG capsule; Take 1 capsule (37.5 mg total) by mouth every morning.  Dispense: 30 capsule; Refill: 2  3. Synovial cyst of right popliteal space No pain or tenderness. Not causing decreased ROM currently. Discussed RICE and NSAIDs for now. Call if symptoms worsen.        Margaretann LovelessJennifer M Olvin Rohr, PA-C  [facility]

## 2018-07-05 NOTE — Patient Instructions (Signed)
Baker Cyst A Baker cyst, also called a popliteal cyst, is a sac-like growth that forms at the back of the knee. The cyst forms when the fluid-filled sac (bursa) that cushions the knee joint becomes enlarged. The bursa that becomes a Baker cyst is located at the back of the knee joint. What are the causes? In most cases, a Baker cyst results from another knee problem that causes swelling inside the knee. This makes the fluid inside the knee joint (synovial fluid) flow into the bursa behind the knee, causing the bursa to enlarge. What increases the risk? You may be more likely to develop a Baker cyst if you already have a knee problem, such as:  A tear in cartilage that cushions the knee joint (meniscal tear).  A tear in the tissues that connect the bones of the knee joint (ligament tear).  Knee swelling from osteoarthritis, rheumatoid arthritis, or gout.  What are the signs or symptoms? A Baker cyst does not always cause symptoms. A lump behind the knee may be the only sign of the condition. The lump may be painful, especially when the knee is straightened. If the lump is painful, the pain may come and go. The knee may also be stiff. Symptoms may quickly get more severe if the cyst breaks open (ruptures). If your cyst ruptures, signs and symptoms may affect the knee and the back of the lower leg (calf) and may include:  Sudden or worsening pain.  Swelling.  Bruising.  How is this diagnosed? This condition may be diagnosed based on your symptoms and medical history. Your health care provider will also do a physical exam. This may include:  Feeling the cyst to check whether it is tender.  Checking your knee for signs of another knee condition that causes swelling.  You may have imaging tests, such as:  X-rays.  MRI.  Ultrasound.  How is this treated? A Baker cyst that is not painful may go away without treatment. If the cyst gets large or painful, it will likely get better if the  underlying knee problem is treated. Treatment for a Baker cyst may include:  Resting.  Keeping weight off of the knee. This means not leaning on the knee to support your body weight.  NSAIDs to reduce pain and swelling.  A procedure to drain the fluid from the cyst with a needle (aspiration). You may also get an injection of a medicine that reduces swelling (steroid).  Surgery. This may be needed if other treatments do not work. This usually involves correcting knee damage and removing the cyst.  Follow these instructions at home:  Take over-the-counter and prescription medicines only as told by your health care provider.  Rest and return to your normal activities as told by your health care provider. Avoid activities that make pain or swelling worse. Ask your health care provider what activities are safe for you.  Keep all follow-up visits as told by your health care provider. This is important. Contact a health care provider if:  You have knee pain, stiffness, or swelling that does not get better. Get help right away if:  You have sudden or worsening pain and swelling in your calf area. This information is not intended to replace advice given to you by your health care provider. Make sure you discuss any questions you have with your health care provider. Document Released: 12/05/2005 Document Revised: 08/25/2016 Document Reviewed: 08/25/2016 Elsevier Interactive Patient Education  2018 Elsevier Inc.  

## 2018-10-04 ENCOUNTER — Ambulatory Visit: Payer: 59 | Admitting: Physician Assistant

## 2018-10-05 ENCOUNTER — Encounter: Payer: Self-pay | Admitting: Physician Assistant

## 2018-10-05 ENCOUNTER — Ambulatory Visit: Payer: Medicaid Other | Admitting: Physician Assistant

## 2018-10-05 VITALS — BP 112/70 | HR 89 | Temp 97.9°F | Resp 16 | Wt 139.8 lb

## 2018-10-05 DIAGNOSIS — R030 Elevated blood-pressure reading, without diagnosis of hypertension: Secondary | ICD-10-CM | POA: Diagnosis not present

## 2018-10-05 DIAGNOSIS — Z23 Encounter for immunization: Secondary | ICD-10-CM

## 2018-10-05 DIAGNOSIS — R079 Chest pain, unspecified: Secondary | ICD-10-CM

## 2018-10-05 MED ORDER — TRIAMTERENE-HCTZ 37.5-25 MG PO TABS: 1.0000 | ORAL_TABLET | Freq: Every day | ORAL | 3 refills | Status: DC | PRN

## 2018-10-05 NOTE — Progress Notes (Signed)
Patient: Wendy Hunt Female    DOB: [birth year]   32 y.o.   MRN: [id number] Visit Date: 10/05/2018  Today's Provider: Margaretann Loveless, PA-C   Chief Complaint  Patient presents with  . Hypertension   Subjective:    HPI Patient here today with c/o elevated blood pressure. Per patient she had a dull ache in her left temporal a few weeks ago and was seing some shadows "ocular" with her left eye with pressure. She thought maybe the optic nerve is being pressed and that when she decided to check her blood pressure. She reports that she stopped the Phentermine but is not like she was taking it everyday, so she is not sure if this is related to her bp being high. Patient reports her blood pressure ranging in the mid's 130/90 and this morning it was 120/80's.     Allergies  Allergen Reactions  . Sulfa Antibiotics      Current Outpatient Medications:  .  phentermine 37.5 MG capsule, Take 1 capsule (37.5 mg total) by mouth every morning. (Patient not taking: Reported on 10/05/2018), Disp: 30 capsule, Rfl: 2  Review of Systems  Constitutional: Negative.   Respiratory: Negative.   Cardiovascular: Negative.   Gastrointestinal: Negative.   Neurological: Negative.   Psychiatric/Behavioral: Negative.     Social History   Tobacco Use  . Smoking status: Former Smoker    Packs/day: 0.50    Years: 8.00    Pack years: 4.00    Last attempt to quit: 12/19/2011    Years since quitting: 6.8  . Smokeless tobacco: Never Used  Substance Use Topics  . Alcohol use: No   Objective:   BP 112/70 (BP Location: Left Arm, Patient Position: Sitting, Cuff Size: Normal)   Pulse 89   Temp 97.9 F (36.6 C) (Oral)   Resp 16   Wt 139 lb 12.8 oz (63.4 kg)   SpO2 99%   BMI 27.30 kg/m  Vitals:   10/05/18 0828  BP: 112/70  Pulse: 89  Resp: 16  Temp: 97.9 F (36.6 C)  TempSrc: Oral  SpO2: 99%  Weight: 139 lb 12.8 oz (63.4 kg)     Physical Exam  Constitutional: She is oriented to  person, place, and time. She appears well-developed and well-nourished. No distress.  Eyes: Pupils are equal, round, and reactive to light. Conjunctivae and EOM are normal.  Neck: Normal range of motion. Neck supple. No JVD present. Carotid bruit is not present. No tracheal deviation present. No thyromegaly present.  Cardiovascular: Normal rate, regular rhythm, normal heart sounds and intact distal pulses. Exam reveals no gallop and no friction rub.  No murmur heard. Pulmonary/Chest: Effort normal and breath sounds normal. No respiratory distress. She has no wheezes. She has no rales.  Lymphadenopathy:    She has no cervical adenopathy.  Neurological: She is alert and oriented to person, place, and time. No cranial nerve deficit. Coordination normal.  Skin: Skin is warm. Capillary refill takes less than 2 seconds. She is not diaphoretic.  Psychiatric: She has a normal mood and affect. Her behavior is normal. Judgment and thought content normal.  Vitals reviewed.      Assessment & Plan:     1. Elevated blood pressure reading EKG today showed NSR with rate variant (occurred during a deep breath). Rate of 72, no ST changes, low voltage in limb leads. Unchanged from 2017. BP normal today. Will give Maxzide as below for intermittent HTN. Anytime her  BP is over 130/90 she may take one maxzide. She may follow up prn.  - triamterene-hydrochlorothiazide (MAXZIDE-25) 37.5-25 MG tablet; Take 1 tablet by mouth daily as needed.  Dispense: 90 tablet; Refill: 3  2. Intermittent chest pain See above medical treatment plan. - EKG 12-Lead  3. Need for influenza vaccination Flu vaccine given today without complication. Patient sat upright for 15 minutes to check for adverse reaction before being released. - Flu Vaccine QUAD 36+ mos IM       Margaretann Loveless, PA-C  [facility]

## 2018-10-09 ENCOUNTER — Encounter: Payer: Self-pay | Admitting: Physician Assistant

## 2018-10-13 ENCOUNTER — Ambulatory Visit: Payer: 59

## 2018-10-20 ENCOUNTER — Encounter: Payer: Self-pay | Admitting: Physician Assistant

## 2018-10-31 ENCOUNTER — Encounter: Payer: Self-pay | Admitting: Physician Assistant

## 2018-10-31 DIAGNOSIS — H539 Unspecified visual disturbance: Secondary | ICD-10-CM

## 2018-11-01 ENCOUNTER — Telehealth: Payer: Self-pay

## 2018-11-01 NOTE — Telephone Encounter (Signed)
printed of immunization records and titers, patient was notified. KW

## 2018-11-01 NOTE — Telephone Encounter (Signed)
Patient needs a copy of her immunization record and titers. She states she had titers done years ago when she was a patient of Dr. Elease HashimotoMaloney. She now sees [place].  Please check [facility] for complete immunizations and labs for any titer results. Patient needs this done ASAP (hopefully today). Call patient when ready for pick up.

## 2018-11-13 ENCOUNTER — Telehealth: Payer: Self-pay | Admitting: Physician Assistant

## 2018-11-13 NOTE — Telephone Encounter (Signed)
Pt states that she purposely popped her neck while in the shower and has not had anymore problems with her vision.She does not feel she needs [facility] at this time

## 2018-11-13 NOTE — Telephone Encounter (Signed)
Ok referral can be cancelled

## 2018-11-13 NOTE — Telephone Encounter (Signed)
Patient wants to know if she should still go to the eye doctor due to the problem be automated corrected. I stated to the patient that you ok to cancel the appointment.

## 2018-11-17 ENCOUNTER — Encounter: Payer: Self-pay | Admitting: Physician Assistant

## 2018-11-17 DIAGNOSIS — H33311 Horseshoe tear of retina without detachment, right eye: Secondary | ICD-10-CM | POA: Diagnosis not present

## 2018-11-17 DIAGNOSIS — H5319 Other subjective visual disturbances: Secondary | ICD-10-CM | POA: Diagnosis not present

## 2018-11-19 ENCOUNTER — Encounter: Payer: Self-pay | Admitting: Physician Assistant

## 2018-11-19 DIAGNOSIS — R5383 Other fatigue: Secondary | ICD-10-CM

## 2018-11-19 DIAGNOSIS — H539 Unspecified visual disturbance: Secondary | ICD-10-CM

## 2018-11-19 DIAGNOSIS — R635 Abnormal weight gain: Secondary | ICD-10-CM

## 2018-11-22 DIAGNOSIS — H5319 Other subjective visual disturbances: Secondary | ICD-10-CM | POA: Diagnosis not present

## 2019-02-08 ENCOUNTER — Telehealth: Payer: Self-pay

## 2019-02-08 NOTE — Telephone Encounter (Signed)
Pt called requesting a call back about a insurance claim that was denied from October.  Callback # [phone].  Pt has called several times requesting a call back and nobody has called her back.

## 2019-02-11 NOTE — Telephone Encounter (Signed)
Patient advised that medicaid paid her claim.  cbe

## 2019-03-05 ENCOUNTER — Encounter: Payer: Self-pay | Admitting: Physician Assistant

## 2019-03-05 ENCOUNTER — Telehealth: Payer: Self-pay | Admitting: Physician Assistant

## 2019-03-05 NOTE — Telephone Encounter (Signed)
Patient works for [employer] and was sent home yesterday because she is coughing, has sore throat, low grade fever over the weekend but not now, laryngitis.    She wants to know if she needs to come in or can we call her in something. [facility] [address].

## 2019-03-05 NOTE — Telephone Encounter (Signed)
Please advise 

## 2019-03-06 ENCOUNTER — Other Ambulatory Visit: Payer: Self-pay

## 2019-03-06 ENCOUNTER — Encounter: Payer: Self-pay | Admitting: Family Medicine

## 2019-03-06 ENCOUNTER — Ambulatory Visit (INDEPENDENT_AMBULATORY_CARE_PROVIDER_SITE_OTHER): Payer: 59 | Admitting: Family Medicine

## 2019-03-06 VITALS — BP 118/88 | HR 103 | Temp 98.5°F | Wt 147.6 lb

## 2019-03-06 DIAGNOSIS — B9789 Other viral agents as the cause of diseases classified elsewhere: Secondary | ICD-10-CM | POA: Diagnosis not present

## 2019-03-06 DIAGNOSIS — J069 Acute upper respiratory infection, unspecified: Secondary | ICD-10-CM | POA: Diagnosis not present

## 2019-03-06 LAB — POCT RAPID STREP A (OFFICE): Rapid Strep A Screen: NEGATIVE

## 2019-03-06 NOTE — Patient Instructions (Signed)

## 2019-03-06 NOTE — Telephone Encounter (Signed)
See my chart message

## 2019-03-06 NOTE — Progress Notes (Signed)
Patient: Wendy Hunt Female    DOB: Apr 24, 1986   33 y.o.   MRN: [id number] Visit Date: 03/06/2019  Today's Provider: Shirlee Latch, MD   Chief Complaint  Patient presents with  . URI   Subjective:    I, Porsha McClurkin CMA, am acting as a Neurosurgeon for Shirlee Latch, MD.   URI   The current episode started in the past 7 days. The problem has been gradually worsening. There has been no fever. Associated symptoms include chest pain, congestion, coughing, diarrhea, a sore throat and wheezing. She has tried nothing for the symptoms.   Hoarse voice Better today  Started 4 days ago Worse at night Up all night coughing Wheezing is better Chest pain from coughing Non productive  No known sick exposures.   Allergies  Allergen Reactions  . Sulfa Antibiotics      Current Outpatient Medications:  .  triamterene-hydrochlorothiazide (MAXZIDE-25) 37.5-25 MG tablet, Take 1 tablet by mouth daily as needed., Disp: 90 tablet, Rfl: 3 .  phentermine 37.5 MG capsule, Take 1 capsule (37.5 mg total) by mouth every morning. (Patient not taking: Reported on 10/05/2018), Disp: 30 capsule, Rfl: 2  Review of Systems  Constitutional: Negative.   HENT: Positive for congestion and sore throat.   Respiratory: Positive for cough, chest tightness (chest pain), shortness of breath and wheezing.   Cardiovascular: Positive for chest pain.  Gastrointestinal: Positive for diarrhea.  Neurological: Positive for light-headedness.    Social History   Tobacco Use  . Smoking status: Former Smoker    Packs/day: 0.50    Years: 8.00    Pack years: 4.00    Last attempt to quit: 12/19/2011    Years since quitting: 7.2  . Smokeless tobacco: Never Used  Substance Use Topics  . Alcohol use: No      Objective:   BP 118/88 (BP Location: Left Arm, Patient Position: Sitting, Cuff Size: Normal)   Pulse (!) 103   Temp 98.5 F (36.9 C) (Oral)   Wt 147 lb 9.6 oz (67 kg)   SpO2 99%   BMI  28.83 kg/m  Vitals:   03/06/19 0822  BP: 118/88  Pulse: (!) 103  Temp: 98.5 F (36.9 C)  TempSrc: Oral  SpO2: 99%  Weight: 147 lb 9.6 oz (67 kg)     Physical Exam Vitals signs reviewed.  Constitutional:      General: She is not in acute distress.    Appearance: Normal appearance. She is well-developed.  HENT:     Head: Normocephalic and atraumatic.     Right Ear: Tympanic membrane, ear canal and external ear normal.     Left Ear: Tympanic membrane, ear canal and external ear normal.     Nose: Nose normal.     Right Sinus: No maxillary sinus tenderness or frontal sinus tenderness.     Left Sinus: No maxillary sinus tenderness or frontal sinus tenderness.     Mouth/Throat:     Mouth: Mucous membranes are moist.     Pharynx: Posterior oropharyngeal erythema present.     Tonsils: Tonsillar exudate present.  Eyes:     General: No scleral icterus.    Conjunctiva/sclera: Conjunctivae normal.  Cardiovascular:     Rate and Rhythm: Normal rate and regular rhythm.     Pulses: Normal pulses.     Heart sounds: Normal heart sounds. No murmur.  Pulmonary:     Effort: Pulmonary effort is normal. No respiratory distress.  Breath sounds: Normal breath sounds. No wheezing, rhonchi or rales.  Musculoskeletal:     Right lower leg: No edema.     Left lower leg: No edema.  Skin:    General: Skin is warm and dry.     Capillary Refill: Capillary refill takes less than 2 seconds.     Findings: No rash.  Neurological:     Mental Status: She is alert and oriented to person, place, and time. Mental status is at baseline.     Results for orders placed or performed in visit on 03/06/19  POCT rapid strep A  Result Value Ref Range   Rapid Strep A Screen Negative Negative        Assessment & Plan   1. Viral URI with cough - symptoms and exam c/w viral URI - no evidence of strep pharyngitis (rapid swab negative), CAP, AOM, bacterial sinusitis, or other bacterial infection - discussed  symptomatic management, natural course, and return precautions  - work note given - no signs of COVID19   Return if symptoms worsen or fail to improve.   The entirety of the information documented in the History of Present Illness, Review of Systems and Physical Exam were personally obtained by me. Portions of this information were initially documented by [facility], CMA and reviewed by me for thoroughness and accuracy.    Erasmo Downer, MD, MPH [facility] 03/06/2019 9:38 AM '

## 2019-03-11 ENCOUNTER — Ambulatory Visit: Payer: Self-pay | Admitting: Physician Assistant

## 2019-03-11 ENCOUNTER — Telehealth: Payer: Self-pay

## 2019-03-11 NOTE — Telephone Encounter (Signed)
Patient states she went out of town this weekend and was expoure to someone that was tested positive for the COVID-19 virus. Patient states

## 2019-04-10 ENCOUNTER — Encounter: Payer: Self-pay | Admitting: Family Medicine

## 2019-04-10 MED ORDER — ALBUTEROL SULFATE HFA 108 (90 BASE) MCG/ACT IN AERS: 2.0000 | INHALATION_SPRAY | Freq: Four times a day (QID) | RESPIRATORY_TRACT | 0 refills | Status: DC | PRN

## 2019-04-30 ENCOUNTER — Encounter: Payer: Self-pay | Admitting: Physician Assistant

## 2019-05-01 ENCOUNTER — Encounter: Payer: Self-pay | Admitting: Physician Assistant

## 2019-05-01 ENCOUNTER — Other Ambulatory Visit: Payer: Self-pay

## 2019-05-01 ENCOUNTER — Ambulatory Visit (INDEPENDENT_AMBULATORY_CARE_PROVIDER_SITE_OTHER): Payer: 59 | Admitting: Physician Assistant

## 2019-05-01 VITALS — BP 118/87 | HR 91 | Temp 98.7°F | Resp 16 | Ht 59.0 in | Wt 151.4 lb

## 2019-05-01 DIAGNOSIS — D508 Other iron deficiency anemias: Secondary | ICD-10-CM

## 2019-05-01 DIAGNOSIS — Z1329 Encounter for screening for other suspected endocrine disorder: Secondary | ICD-10-CM | POA: Diagnosis not present

## 2019-05-01 DIAGNOSIS — Z13220 Encounter for screening for lipoid disorders: Secondary | ICD-10-CM

## 2019-05-01 DIAGNOSIS — Z6830 Body mass index (BMI) 30.0-30.9, adult: Secondary | ICD-10-CM

## 2019-05-01 DIAGNOSIS — Z0000 Encounter for general adult medical examination without abnormal findings: Secondary | ICD-10-CM | POA: Diagnosis not present

## 2019-05-01 DIAGNOSIS — E6609 Other obesity due to excess calories: Secondary | ICD-10-CM

## 2019-05-01 DIAGNOSIS — Z136 Encounter for screening for cardiovascular disorders: Secondary | ICD-10-CM

## 2019-05-01 DIAGNOSIS — Z713 Dietary counseling and surveillance: Secondary | ICD-10-CM | POA: Diagnosis not present

## 2019-05-01 MED ORDER — PHENTERMINE HCL 37.5 MG PO CAPS: 37.5000 mg | ORAL_CAPSULE | ORAL | 2 refills | Status: DC

## 2019-05-01 NOTE — Progress Notes (Signed)
Patient: Wendy Hunt, Female    DOB: 1986/06/26, 33 y.o.   MRN: [id number] Visit Date: 05/01/2019  Today's Provider: Margaretann LovelessJennifer M Syncere Eble, PA-C   Chief Complaint  Patient presents with  . Annual Exam   Subjective:     Annual physical exam Wendy Hunt is a 33 y.o. female who presents today for health maintenance and complete physical. She feels well. She reports exercising some. She reports she is sleeping well. ----------------------------------------------------------------- WUJ:[id number]:Normal 02/09/2017  Review of Systems  Constitutional: Positive for activity change and unexpected weight change.  HENT: Negative.   Eyes: Negative.   Respiratory: Positive for wheezing.   Cardiovascular: Negative.   Gastrointestinal: Positive for vomiting (on Mother's day and the next day).  Endocrine: Negative.   Genitourinary: Negative.   Musculoskeletal: Negative.   Skin: Negative.   Allergic/Immunologic: Positive for environmental allergies.  Neurological: Negative.   Hematological: Negative.   Psychiatric/Behavioral: Negative.     Social History      She  reports that she quit smoking about 7 years ago. She has a 4.00 pack-year smoking history. She has never used smokeless tobacco. She reports that she does not drink alcohol or use drugs.       Social History   Socioeconomic History  . Marital status: Married    Spouse name: Not on file  . Number of children: 1  . Years of education: College  . Highest education level: Not on file  Occupational History    Comment: Full-time    Employer: [employer]  Social Needs  . Financial resource strain: Not on file  . Food insecurity:    Worry: Not on file    Inability: Not on file  . Transportation needs:    Medical: Not on file    Non-medical: Not on file  Tobacco Use  . Smoking status: Former Smoker    Packs/day: 0.50    Years: 8.00    Pack years: 4.00    Last attempt to quit: 12/19/2011    Years since  quitting: 7.3  . Smokeless tobacco: Never Used  Substance and Sexual Activity  . Alcohol use: No  . Drug use: No  . Sexual activity: Never  Lifestyle  . Physical activity:    Days per week: Not on file    Minutes per session: Not on file  . Stress: Not on file  Relationships  . Social connections:    Talks on phone: Not on file    Gets together: Not on file    Attends religious service: Not on file    Active member of club or organization: Not on file    Attends meetings of clubs or organizations: Not on file    Relationship status: Not on file  Other Topics Concern  . Not on file  Social History Narrative  . Not on file    History reviewed. No pertinent past medical history.   Patient Active Problem List   Diagnosis Date Noted  . Anemia, iron deficiency 07/17/2015  . Excess, menstruation 07/17/2015  . RAD (reactive airway disease) 07/17/2015  . Airway hyperreactivity 07/17/2015    Past Surgical History:  Procedure Laterality Date  . CESAREAN SECTION  November 2013  . WISDOM TOOTH EXTRACTION  2003    Family History        Family Status  Relation Name Status  . Mother  Alive  . Father  Alive  . MGF  (Not Specified)  . PGM  (  Not Specified)  . PGF  (Not Specified)  . Other  (Not Specified)  . MGM  (Not Specified)        Her family history includes Angina in her maternal grandmother; Breast cancer in her paternal grandmother; Diabetes in her maternal grandfather, paternal grandmother, and another family member; Esophageal cancer in her paternal grandfather; Glaucoma in an other family member; Healthy in her father and mother; Hyperlipidemia in her mother.      Allergies  Allergen Reactions  . Sulfa Antibiotics      Current Outpatient Medications:  .  albuterol (VENTOLIN HFA) 108 (90 Base) MCG/ACT inhaler, Inhale 2 puffs into the lungs every 6 (six) hours as needed for wheezing or shortness of breath., Disp: 1 Inhaler, Rfl: 0 .  phentermine 37.5 MG capsule,  Take 1 capsule (37.5 mg total) by mouth every morning. (Patient not taking: Reported on 10/05/2018), Disp: 30 capsule, Rfl: 2   Patient Care Team: Margaretann Loveless, PA-C as PCP - General (Family Medicine)    Objective:    Vitals: BP 118/87 (BP Location: Left Arm, Patient Position: Sitting, Cuff Size: Large)   Pulse 91   Temp 98.7 F (37.1 C) (Oral)   Resp 16   Ht  (1.499 m)   Wt 151 lb 6.4 oz (68.7 kg)   BMI 30.58 kg/m    Vitals:   05/01/19 0856  BP: 118/87  Pulse: 91  Resp: 16  Temp: 98.7 F (37.1 C)  TempSrc: Oral  Weight: 151 lb 6.4 oz (68.7 kg)  Height:  (1.499 m)     Physical Exam Vitals signs reviewed.  Constitutional:      General: She is not in acute distress.    Appearance: Normal appearance. She is well-developed. She is obese. She is not ill-appearing or diaphoretic.  HENT:     Head: Normocephalic and atraumatic.     Right Ear: Tympanic membrane, ear canal and external ear normal.     Left Ear: Tympanic membrane, ear canal and external ear normal.     Nose: Nose normal.     Mouth/Throat:     Mouth: Mucous membranes are moist.     Pharynx: Oropharynx is clear. No oropharyngeal exudate.  Eyes:     General: No scleral icterus.       Right eye: No discharge.        Left eye: No discharge.     Extraocular Movements: Extraocular movements intact.     Conjunctiva/sclera: Conjunctivae normal.     Pupils: Pupils are equal, round, and reactive to light.  Neck:     Musculoskeletal: Normal range of motion and neck supple.     Thyroid: No thyromegaly.     Vascular: No JVD.     Trachea: No tracheal deviation.  Cardiovascular:     Rate and Rhythm: Normal rate and regular rhythm.     Pulses: Normal pulses.     Heart sounds: Normal heart sounds. No murmur. No friction rub. No gallop.   Pulmonary:     Effort: Pulmonary effort is normal. No respiratory distress.     Breath sounds: Normal breath sounds. No wheezing or rales.  Chest:     Chest  wall: No tenderness.     Breasts:        Right: Normal. No swelling, bleeding, inverted nipple, mass, nipple discharge, skin change or tenderness.        Left: Normal. No swelling, bleeding, inverted nipple, mass, nipple discharge, skin change or tenderness.  Abdominal:     General: Bowel sounds are normal. There is no distension.     Palpations: Abdomen is soft. There is no mass.     Tenderness: There is no abdominal tenderness. There is no guarding or rebound.  Musculoskeletal: Normal range of motion.        General: No tenderness.  Lymphadenopathy:     Cervical: No cervical adenopathy.     Upper Body:     Right upper body: No supraclavicular, axillary or pectoral adenopathy.     Left upper body: No supraclavicular, axillary or pectoral adenopathy.  Skin:    General: Skin is warm and dry.     Findings: No rash.  Neurological:     Mental Status: She is alert and oriented to person, place, and time.  Psychiatric:        Mood and Affect: Mood normal.        Behavior: Behavior normal.        Thought Content: Thought content normal.        Judgment: Judgment normal.      Depression Screen PHQ 2/9 Scores 05/01/2019 02/09/2017  PHQ - 2 Score 0 0       Assessment & Plan:     Routine Health Maintenance and Physical Exam  Exercise Activities and Dietary recommendations Goals    . Exercise 150 minutes per week (moderate activity)       Immunization History  Administered Date(s) Administered  . HPV Quadrivalent 02/09/2011  . Influenza,inj,Quad PF,6+ Mos 10/22/2013, 08/28/2014, 12/31/2015, 01/19/2017, 10/20/2017, 10/05/2018    Health Maintenance  Topic Date Due  . Janet Berlin  05/16/2004  . INFLUENZA VACCINE  07/20/2019  . PAP SMEAR-Modifier  02/10/2020  . HIV Screening  Completed     Discussed health benefits of physical activity, and encouraged her to engage in regular exercise appropriate for her age and condition.    1. Annual physical exam Normal physical  exam today. Will check labs as below and f/u pending lab results. If labs are stable and WNL she will not need to have these rechecked for one year at her next annual physical exam. She is to call the office in the meantime if she has any acute issue, questions or concerns. - CBC w/Diff/Platelet - Comprehensive Metabolic Panel (CMET) - TSH - Lipid Profile  2. Iron deficiency anemia secondary to inadequate dietary iron intake Stable. Will check labs as below and f/u pending results. - CBC w/Diff/Platelet - Comprehensive Metabolic Panel (CMET)  3. Class 1 obesity due to excess calories without serious comorbidity with body mass index (BMI) of 30.0 to 30.9 in adult Counseled patient on healthy lifestyle modifications including dieting and exercise. Will start phentermine as below x 3 months.  - Comprehensive Metabolic Panel (CMET) - phentermine 37.5 MG capsule; Take 1 capsule (37.5 mg total) by mouth every morning.  Dispense: 30 capsule; Refill: 2  4. Encounter for lipid screening for cardiovascular disease Will check labs as below and f/u pending results. - Lipid Profile  5. Thyroid disorder screen Will check labs as below and f/u pending results. - TSH  6. Weight loss counseling, encounter for See above medical treatment plan for #3. - phentermine 37.5 MG capsule; Take 1 capsule (37.5 mg total) by mouth every morning.  Dispense: 30 capsule; Refill: 2  --------------------------------------------------------------------    Margaretann Loveless, PA-C  [facility]

## 2019-05-01 NOTE — Patient Instructions (Signed)

## 2019-05-24 DIAGNOSIS — R002 Palpitations: Secondary | ICD-10-CM | POA: Diagnosis not present

## 2019-05-24 DIAGNOSIS — R000 Tachycardia, unspecified: Secondary | ICD-10-CM | POA: Diagnosis not present

## 2019-05-25 ENCOUNTER — Encounter: Payer: Self-pay | Admitting: Physician Assistant

## 2019-05-28 DIAGNOSIS — R002 Palpitations: Secondary | ICD-10-CM | POA: Diagnosis not present

## 2019-06-04 DIAGNOSIS — R002 Palpitations: Secondary | ICD-10-CM | POA: Diagnosis not present

## 2019-06-04 DIAGNOSIS — I4711 Inappropriate sinus tachycardia, so stated: Secondary | ICD-10-CM | POA: Insufficient documentation

## 2019-06-04 DIAGNOSIS — R000 Tachycardia, unspecified: Secondary | ICD-10-CM | POA: Diagnosis not present

## 2019-06-11 ENCOUNTER — Encounter: Payer: Self-pay | Admitting: Physician Assistant

## 2019-08-21 NOTE — Progress Notes (Signed)
Patient: Wendy FuKathleen E Mousseau Female    DOB: [birth year]   33 y.o.   MRN: [id number] Visit Date: 08/22/2019  Today's Provider: Margaretann LovelessJennifer M Burnette, PA-C   Chief Complaint  Patient presents with  . Breast Mass   Subjective:    I,Joseline E. Rosas,RMA am acting as a Neurosurgeonscribe for PPG IndustriesJennifer M. Burnette, PA-C.  HPI  Breast Lump/Pain: Patient presents for evaluation of a breast lump/pain, left breast. Reports that she has noticed the sharp pain off and on for a while.  Reports whenever she gets the pain it last only for a few seconds. Patient does routinely do self breast exams. Breast cancer risk factors include family hx on father's side.   Age of menarche was 9. Last menstrual period was 08/21/19 (just started yesterday) . Patient denies nipple discharge. Patient denies to previous breast biopsy. Patient denies a personal history of breast cancer. Patient does has a history of smoking. She smoked on and off for 8-10 yrs.   Allergies  Allergen Reactions  . Sulfa Antibiotics      Current Outpatient Medications:  .  albuterol (VENTOLIN HFA) 108 (90 Base) MCG/ACT inhaler, Inhale 2 puffs into the lungs every 6 (six) hours as needed for wheezing or shortness of breath., Disp: 1 Inhaler, Rfl: 0 .  phentermine 37.5 MG capsule, Take 1 capsule (37.5 mg total) by mouth every morning. (Patient not taking: Reported on 08/22/2019), Disp: 30 capsule, Rfl: 2  Review of Systems  Constitutional: Negative for appetite change, chills, fatigue and fever.  Respiratory: Negative for chest tightness and shortness of breath.   Cardiovascular: Negative for chest pain and palpitations.  Gastrointestinal: Negative for abdominal pain, nausea and vomiting.  Neurological: Negative for dizziness and weakness.    Social History   Tobacco Use  . Smoking status: Former Smoker    Packs/day: 0.50    Years: 8.00    Pack years: 4.00    Quit date: 12/19/2011    Years since quitting: 7.6  . Smokeless tobacco: Never  Used  Substance Use Topics  . Alcohol use: No      Objective:   BP 115/81 (BP Location: Left Arm, Patient Position: Sitting, Cuff Size: Normal)   Pulse 90   Temp (!) 97.1 F (36.2 C) (Other (Comment)) Comment (Src): forehead  Resp 16   Wt 151 lb (68.5 kg)   BMI 30.50 kg/m  Vitals:   08/22/19 0848  BP: 115/81  Pulse: 90  Resp: 16  Temp: (!) 97.1 F (36.2 C)  TempSrc: Other (Comment)  Weight: 151 lb (68.5 kg)  Body mass index is 30.5 kg/m.   Physical Exam Constitutional:      General: She is not in acute distress.    Appearance: Normal appearance. She is not ill-appearing.  HENT:     Head: Normocephalic and atraumatic.  Neck:     Musculoskeletal: Normal range of motion and neck supple.  Cardiovascular:     Rate and Rhythm: Normal rate and regular rhythm.     Pulses: Normal pulses.     Heart sounds: Normal heart sounds.  Pulmonary:     Effort: Pulmonary effort is normal. No respiratory distress.     Breath sounds: Normal breath sounds. No stridor. No wheezing, rhonchi or rales.  Chest:     Breasts:        Right: Normal. No mass, nipple discharge, skin change or tenderness.        Left: Mass and tenderness present. No  nipple discharge or skin change.    Lymphadenopathy:     Upper Body:     Right upper body: No supraclavicular, axillary, pectoral or epitrochlear adenopathy.     Left upper body: Epitrochlear adenopathy present. No supraclavicular, axillary or pectoral adenopathy.  Skin:      Neurological:     Mental Status: She is alert.      No results found for any visits on 08/22/19.     Assessment & Plan    1. Left breast mass Small nodule noted on exam. Most likely cyst from menstrual cycle. However, since she has family history in a paternal grandmother and mother has history of fibrocystic changes requiring multiple biopsies (all negative). Will get imaging as below. - MM DIAG BREAST TOMO BILATERAL; Future - US BREAST LTD UNI LEFT INC AXILLA;  Future  2. Pleuritic chest pain XR ordered for patient since she has smoking history and reports reproducible pleuritic chest pain along sternal border. Will get xray as below.  - DG Chest 2 View; Future     Mar Daring, PA-C  [facility]

## 2019-08-22 ENCOUNTER — Ambulatory Visit
Admission: RE | Admit: 2019-08-22 | Discharge: 2019-08-22 | Disposition: A | Discharge status: Home / Self Care | Payer: 59 | Attending: Physician Assistant | Admitting: Physician Assistant

## 2019-08-22 ENCOUNTER — Encounter: Payer: Self-pay | Admitting: Physician Assistant

## 2019-08-22 ENCOUNTER — Ambulatory Visit (INDEPENDENT_AMBULATORY_CARE_PROVIDER_SITE_OTHER): Payer: 59 | Admitting: Physician Assistant

## 2019-08-22 ENCOUNTER — Other Ambulatory Visit: Payer: Self-pay

## 2019-08-22 ENCOUNTER — Ambulatory Visit
Admission: RE | Admit: 2019-08-22 | Discharge: 2019-08-22 | Disposition: A | Discharge status: Home / Self Care | Payer: 59 | Source: Ambulatory Visit | Attending: Physician Assistant | Admitting: Physician Assistant

## 2019-08-22 VITALS — BP 115/81 | HR 90 | Temp 97.1°F | Resp 16 | Wt 151.0 lb

## 2019-08-22 DIAGNOSIS — R0781 Pleurodynia: Secondary | ICD-10-CM | POA: Insufficient documentation

## 2019-08-22 DIAGNOSIS — N6320 Unspecified lump in the left breast, unspecified quadrant: Secondary | ICD-10-CM

## 2019-08-22 DIAGNOSIS — R079 Chest pain, unspecified: Secondary | ICD-10-CM | POA: Diagnosis not present

## 2019-08-22 NOTE — Patient Instructions (Signed)
Breast Cyst  A breast cyst is a sac in the breast that is filled with fluid. Breast cysts are usually noncancerous (benign). They are common among women, and they are most often located in the upper, outer portion of the breast. One or more cysts may develop. They form when fluid builds up inside of the breast glands. There are several types of breast cysts:  Macrocyst. This is a cyst that is about 2 inches (5.1 cm) across (in diameter).  Microcyst. This is a very small cyst that you cannot feel, but it can be seen with imaging tests such as an X-ray of the breast (mammogram) or ultrasound.  Galactocele. This is a cyst that contains milk. It may develop if you suddenly stop breastfeeding. Breast cysts do not increase your risk of breast cancer. They usually disappear after menopause, unless you take artificial hormones (are on hormone therapy). What are the causes? The exact cause of breast cysts is not known. Possible causes include:  Blockage of tubes (ducts) in the breast glands, which leads to fluid buildup. Duct blockage may result from: ? Fibrocystic breast changes. This is a common, benign condition that occurs when women go through hormonal changes during the menstrual cycle. This is a common cause of multiple breast cysts. ? Overgrowth of breast tissue or breast glands. ? Scar tissue in the breast from previous surgery.  Changes in certain female hormones (estrogen and progesterone). What increases the risk? You may be more likely to develop breast cysts if you have not gone through menopause. What are the signs or symptoms? Symptoms of a breast cyst may include:  Feeling one or more smooth, round, soft lumps (like grapes) in the breast that are easily moveable. The lump(s) may get bigger and more painful before your period and get smaller after your period.  Breast discomfort or pain. How is this diagnosed? A cyst can be felt during a physical exam by your health care provider.  A mammogram and ultrasound will be done to confirm the diagnosis. Fluid may be removed from the cyst with a needle (fine-needle aspiration) and tested to make sure the cyst is not cancerous. How is this treated? Treatment may not be necessary. Your health care provider may monitor the cyst to see if it goes away on its own. If the cyst is uncomfortable or gets bigger, or if you do not like how the cyst makes your breast look, you may need treatment. Treatment may include:  Hormone treatment.  Fine-needle aspiration, to drain fluid from the cyst. There is a chance of the cyst coming back (recurring) after aspiration.  Surgery to remove the cyst. Follow these instructions at home:  See your health care provider regularly. ? Get a yearly physical exam. ? If you are 20-40 years old, get a clinical breast exam every 1-3 years. After age 40, get this exam every year. ? Get mammograms as often as directed.  Do a breast self-exam every month, or as often as directed. Having many breast cysts, or "lumpy" breasts, may make it harder to feel for new lumps. Understand how your breasts normally look and feel, and write down any changes in your breasts so you can tell your health care provider about the changes. A breast self-exam involves: ? Comparing your breasts in the mirror. ? Looking for visible changes in your skin or nipples. ? Feeling for lumps or changes.  Take over-the-counter and prescription medicines only as told by your health care provider.  Wear   a supportive bra, especially when exercising.  Follow instructions from your health care provider about eating and drinking restrictions. ? Avoid caffeine. ? Cut down on salt (sodium) in what you eat and drink, especially before your menstrual period. Too much sodium can cause fluid buildup (retention), breast swelling, and discomfort.  Keep all follow-up visits as told your health care provider. This is important. Contact a health care  provider if:  You feel, or think you feel, a lump in your breast.  You notice that both breasts look or feel different than usual.  Your breast is still causing pain after your menstrual period is over.  You find new lumps or bumps that were not there before.  You feel lumps in your armpit (axilla). Get help right away if:  You have severe pain, tenderness, redness, or warmth in your breast.  You have fluid or blood leaking from your nipple.  Your breast lump becomes hard and painful.  You notice dimpling or wrinkling of the breast or nipple. This information is not intended to replace advice given to you by your health care provider. Make sure you discuss any questions you have with your health care provider. Document Released: 12/05/2005 Document Revised: 11/17/2017 Document Reviewed: 08/26/2016 Elsevier Patient Education  2020 [employer]  

## 2019-08-23 ENCOUNTER — Encounter: Payer: Self-pay | Admitting: Physician Assistant

## 2019-09-02 ENCOUNTER — Ambulatory Visit
Admission: RE | Admit: 2019-09-02 | Discharge: 2019-09-02 | Disposition: A | Discharge status: Home / Self Care | Payer: 59 | Source: Ambulatory Visit | Attending: Physician Assistant | Admitting: Physician Assistant

## 2019-09-02 ENCOUNTER — Encounter: Payer: Self-pay | Admitting: Physician Assistant

## 2019-09-02 DIAGNOSIS — N6320 Unspecified lump in the left breast, unspecified quadrant: Secondary | ICD-10-CM | POA: Insufficient documentation

## 2019-09-02 DIAGNOSIS — R928 Other abnormal and inconclusive findings on diagnostic imaging of breast: Secondary | ICD-10-CM | POA: Diagnosis not present

## 2019-09-02 DIAGNOSIS — N644 Mastodynia: Secondary | ICD-10-CM | POA: Diagnosis not present

## 2019-09-30 ENCOUNTER — Encounter: Payer: Self-pay | Admitting: Physician Assistant

## 2019-09-30 DIAGNOSIS — Z713 Dietary counseling and surveillance: Secondary | ICD-10-CM

## 2019-10-02 ENCOUNTER — Ambulatory Visit (INDEPENDENT_AMBULATORY_CARE_PROVIDER_SITE_OTHER): Payer: Medicaid Other

## 2019-10-02 ENCOUNTER — Other Ambulatory Visit: Payer: Self-pay

## 2019-10-02 DIAGNOSIS — Z23 Encounter for immunization: Secondary | ICD-10-CM

## 2019-10-02 MED ORDER — PHENTERMINE HCL 37.5 MG PO TABS: 37.5000 mg | ORAL_TABLET | Freq: Every day | ORAL | 2 refills | Status: DC

## 2019-10-24 ENCOUNTER — Telehealth: Payer: Self-pay

## 2019-10-24 NOTE — Telephone Encounter (Signed)
Pt is requesting call back to see if a prior authorization for her Dx mammogram and ultrasound. Please advise. Thanks TNP

## 2019-10-28 ENCOUNTER — Encounter: Payer: Self-pay | Admitting: Physician Assistant

## 2019-10-31 DIAGNOSIS — Z124 Encounter for screening for malignant neoplasm of cervix: Secondary | ICD-10-CM | POA: Diagnosis not present

## 2019-10-31 DIAGNOSIS — N920 Excessive and frequent menstruation with regular cycle: Secondary | ICD-10-CM | POA: Diagnosis not present

## 2019-11-12 DIAGNOSIS — N920 Excessive and frequent menstruation with regular cycle: Secondary | ICD-10-CM | POA: Diagnosis not present

## 2019-11-12 DIAGNOSIS — N9489 Other specified conditions associated with female genital organs and menstrual cycle: Secondary | ICD-10-CM | POA: Diagnosis not present

## 2019-11-22 ENCOUNTER — Other Ambulatory Visit: Payer: Self-pay | Admitting: Physician Assistant

## 2019-11-22 DIAGNOSIS — R0602 Shortness of breath: Secondary | ICD-10-CM

## 2019-11-22 MED ORDER — ALBUTEROL SULFATE HFA 108 (90 BASE) MCG/ACT IN AERS: 1.0000 | INHALATION_SPRAY | Freq: Four times a day (QID) | RESPIRATORY_TRACT | 1 refills | Status: DC | PRN

## 2019-11-22 NOTE — Telephone Encounter (Signed)
[facility] faxed refill request for the following medications:  albuterol (VENTOLIN HFA) 108 (90 Base) MCG/ACT inhaler   Last Rx: 04/10/2019 LOV: 08/22/2019 Please advise. Thanks TNP

## 2019-11-22 NOTE — Telephone Encounter (Signed)
Please advise if ok to refill. This was prescribed by Dr. B back in 03/2019 for an acute issue.

## 2019-11-25 NOTE — H&P (Signed)
Wendy Hunt is a 33 y.o. female here Fractional D+C , Myosure resection and hysteroscopy . Pt here for menorrhagia . She is interested in future fertility  EMBX neg, pap neg  Saline infusion sonohysterography: betadine prep to the cervix followed by placement of the HSG catheter into the endometrial canal . Sterile H2O is injected while performing a transvaginal u/s . Findings:1.6x 0.9 x1.6 cm polyp noted  Past Medical History:  has a past medical history of Tachycardia.  Past Surgical History:  has a past surgical history that includes Cesarean section. Family History: family history includes Breast cancer in her paternal grandmother. Social History:  reports that she has quit smoking. She has never used smokeless tobacco. She reports that she does not drink alcohol. OB/GYN History:          OB History    Gravida  1   Para  1   Term  1   Preterm      AB      Living  1     SAB      TAB      Ectopic      Molar      Multiple      Live Births  1          Allergies: is allergic to sulfa (sulfonamide antibiotics). Medications:  Current Outpatient Medications:  .  albuterol 90 mcg/actuation inhaler, , Disp: , Rfl:  .  tranexamic acid (LYSTEDA) 650 mg tablet, Take 2 tablets (1,300 mg total) by mouth 3 (three) times daily Take for a maximum of 5 days during monthly menstruation., Disp: 30 tablet, Rfl: 3  Review of Systems: General:                      No fatigue or weight loss Eyes:                           No vision changes Ears:                            No hearing difficulty Respiratory:                No cough or shortness of breath Pulmonary:                  No asthma or shortness of breath Cardiovascular:           No chest pain, palpitations, dyspnea on exertion Gastrointestinal:          No abdominal bloating, chronic diarrhea, constipations, masses, pain or hematochezia Genitourinary:             No hematuria, dysuria, abnormal vaginal discharge,  pelvic pain,+ Menometrorrhagia Lymphatic:                   No swollen lymph nodes Musculoskeletal:         No muscle weakness Neurologic:                  No extremity weakness, syncope, seizure disorder Psychiatric:                  No history of depression, delusions or suicidal/homicidal ideation    Exam:      Vitals:   11/12/19 1001  BP: 113/83  Pulse: 99    Body mass index is 31.51 kg/m.  WDWN white/ female in NAD  Lungs: CTA  CV : RRR without murmur   Abdomen: soft , no mass, normal active bowel sounds,  non-tender, no rebound tenderness Pelvic: tanner stage 5 ,  External genitalia: vulva /labia no lesions Urethra: no prolapse Vagina: normal physiologic d/c, adequate room for lavh if need be  Cervix: no lesions, no cervical motion tenderness   Uterus: normal size shape and contour, non-tender Adnexa: no mass,  non-tender   Saline infusion sonohysterography: betadine prep to the cervix followed by placement of the HSG catheter into the endometrial canal . Sterile H2O is injected while performing a transvaginal u/s . Findings:1.6x 0.9 x1.6 cm polyp noted   Impression:   The primary encounter diagnosis was Endometrial mass. A diagnosis of Menorrhagia with regular cycle was also pertinent to this visit.    Plan:   .spoke to her about the findings and recommended  FX D+C and hysterscopic resection of endometrial mass with the myosure .         Caroline Sauger, MD

## 2019-11-29 ENCOUNTER — Encounter
Admission: RE | Admit: 2019-11-29 | Discharge: 2019-11-29 | Disposition: A | Discharge status: Home / Self Care | Payer: Medicaid Other | Source: Ambulatory Visit | Attending: Obstetrics and Gynecology | Admitting: Obstetrics and Gynecology

## 2019-11-29 ENCOUNTER — Other Ambulatory Visit: Payer: Self-pay

## 2019-11-29 DIAGNOSIS — Z01811 Encounter for preprocedural respiratory examination: Secondary | ICD-10-CM | POA: Insufficient documentation

## 2019-11-29 HISTORY — DX: Anemia, unspecified: D64.9

## 2019-11-29 HISTORY — DX: Palpitations: R00.2

## 2019-11-29 HISTORY — DX: Unspecified asthma, uncomplicated: J45.909

## 2019-11-29 NOTE — Patient Instructions (Signed)
Your procedure is scheduled on: MONDAY 12/09/19 Report to [place]. To find out your arrival time please call [phone] between 1PM - 3PM on Friday 12/06/19.  Remember: Instructions that are not followed completely may result in serious medical risk, up to and including death, or upon the discretion of your surgeon and anesthesiologist your surgery may need to be rescheduled.     _X__ 1. Do not eat food after midnight the night before your procedure.                 No gum chewing or hard candies. You may drink clear liquids up to 2 hours                 before you are scheduled to arrive for your surgery- DO not drink clear                 liquids within 2 hours of the start of your surgery.                 Clear Liquids include:  water, apple juice without pulp, clear carbohydrate                 drink such as Clearfast or Gatorade, Black Coffee or Tea (Do not add                 anything to coffee or tea). Diabetics water only  __X__2.  On the morning of surgery brush your teeth with toothpaste and water, you                 may rinse your mouth with mouthwash if you wish.  Do not swallow any              toothpaste of mouthwash.     _X__ 3.  No Alcohol for 24 hours before or after surgery.   _X__ 4.  Do Not Smoke or use e-cigarettes For 24 Hours Prior to Your Surgery.                 Do not use any chewable tobacco products for at least 6 hours prior to                 surgery.  ____  5.  Bring all medications with you on the day of surgery if instructed.   __X__  6.  Notify your doctor if there is any change in your medical condition      (cold, fever, infections).     Do not wear jewelry, make-up, hairpins, clips or nail polish. Do not wear lotions, powders, or perfumes.  Do not shave 48 hours prior to surgery. Men may shave face and neck. Do not bring valuables to the hospital.    [facility] is not responsible for  any belongings or valuables.  Contacts, dentures/partials or body piercings may not be worn into surgery. Bring a case for your contacts, glasses or hearing aids, a denture cup will be supplied. Leave your suitcase in the car. After surgery it may be brought to your room. For patients admitted to the hospital, discharge time is determined by your treatment team.   Patients discharged the day of surgery will not be allowed to drive home.   Please read over the following fact sheets that you were given:   MRSA Information  __X__ Take these medicines the morning of surgery with A SIP OF WATER:  1. NONE  2.   3.   4.  5.  6.  ____ Fleet Enema (as directed)   ____ Use CHG Soap/SAGE wipes as directed  __X__ Use inhalers on the day of surgery  ____ Stop metformin/Janumet/Farxiga 2 days prior to surgery    ____ Take 1/2 of usual insulin dose the night before surgery. No insulin the morning          of surgery.   ____ Stop Blood Thinners Coumadin/Plavix/Xarelto/Pleta/Pradaxa/Eliquis/Effient/Aspirin  on   Or contact your Surgeon, Cardiologist or Medical Doctor regarding  ability to stop your blood thinners  __X__ Stop Anti-inflammatories 7 days before surgery such as Advil, Ibuprofen, Motrin,  BC or Goodies Powder, Naprosyn, Naproxen, Aleve, Aspirin    __X__ Stop all herbal supplements, fish oil or vitamin E until after surgery.    ____ Bring C-Pap to the hospital.

## 2019-12-05 ENCOUNTER — Other Ambulatory Visit: Payer: Self-pay

## 2019-12-05 ENCOUNTER — Other Ambulatory Visit
Admission: RE | Admit: 2019-12-05 | Discharge: 2019-12-05 | Disposition: A | Discharge status: Home / Self Care | Payer: Medicaid Other | Source: Ambulatory Visit | Attending: Obstetrics and Gynecology | Admitting: Obstetrics and Gynecology

## 2019-12-05 DIAGNOSIS — N920 Excessive and frequent menstruation with regular cycle: Secondary | ICD-10-CM | POA: Insufficient documentation

## 2019-12-05 DIAGNOSIS — N859 Noninflammatory disorder of uterus, unspecified: Secondary | ICD-10-CM | POA: Insufficient documentation

## 2019-12-05 DIAGNOSIS — Z01812 Encounter for preprocedural laboratory examination: Secondary | ICD-10-CM | POA: Insufficient documentation

## 2019-12-05 DIAGNOSIS — Z20828 Contact with and (suspected) exposure to other viral communicable diseases: Secondary | ICD-10-CM | POA: Diagnosis not present

## 2019-12-05 LAB — BASIC METABOLIC PANEL
Anion gap: 9 (ref 5–15)
BUN: 11 mg/dL (ref 6–20)
CO2: 23 mmol/L (ref 22–32)
Calcium: 9.1 mg/dL (ref 8.9–10.3)
Chloride: 109 mmol/L (ref 98–111)
Creatinine, Ser: 0.8 mg/dL (ref 0.44–1.00)
GFR calc Af Amer: >60 mL/min (ref >60)
GFR calc non Af Amer: >60 mL/min (ref >60)
Glucose, Bld: 93 mg/dL (ref 70–99)
Potassium: 4 mmol/L (ref 3.5–5.1)
Sodium: 141 mmol/L (ref 135–145)

## 2019-12-05 LAB — CBC
HCT: 42.8 % (ref 36.0–46.0)
Hemoglobin: 13.8 g/dL (ref 12.0–15.0)
MCH: 27.5 pg (ref 26.0–34.0)
MCHC: 32.2 g/dL (ref 30.0–36.0)
MCV: 85.3 fL (ref 80.0–100.0)
Platelets: 278 10*3/uL (ref 150–400)
RBC: 5.02 MIL/uL (ref 3.87–5.11)
RDW: 13.4 % (ref 11.5–15.5)
WBC: 8.1 10*3/uL (ref 4.0–10.5)
nRBC: 0 % (ref 0.0–0.2)

## 2019-12-05 LAB — SARS CORONAVIRUS 2 (TAT 6-24 HRS): SARS Coronavirus 2: NEGATIVE

## 2019-12-05 LAB — ABO/RH: ABO/RH(D): A POS

## 2019-12-09 ENCOUNTER — Ambulatory Visit
Admission: RE | Admit: 2019-12-09 | Discharge: 2019-12-09 | Disposition: A | Discharge status: Home / Self Care | Payer: Medicaid Other | Attending: Obstetrics and Gynecology | Admitting: Obstetrics and Gynecology

## 2019-12-09 ENCOUNTER — Other Ambulatory Visit: Payer: Self-pay

## 2019-12-09 ENCOUNTER — Ambulatory Visit: Payer: Medicaid Other | Admitting: Anesthesiology

## 2019-12-09 ENCOUNTER — Encounter: Payer: Self-pay | Admitting: Obstetrics and Gynecology

## 2019-12-09 ENCOUNTER — Encounter
Admission: RE | Disposition: A | Discharge status: Home / Self Care | Payer: Self-pay | Source: Home / Self Care | Attending: Obstetrics and Gynecology

## 2019-12-09 DIAGNOSIS — Z803 Family history of malignant neoplasm of breast: Secondary | ICD-10-CM | POA: Insufficient documentation

## 2019-12-09 DIAGNOSIS — Z87891 Personal history of nicotine dependence: Secondary | ICD-10-CM | POA: Insufficient documentation

## 2019-12-09 DIAGNOSIS — N920 Excessive and frequent menstruation with regular cycle: Secondary | ICD-10-CM | POA: Insufficient documentation

## 2019-12-09 DIAGNOSIS — D261 Other benign neoplasm of corpus uteri: Secondary | ICD-10-CM | POA: Diagnosis not present

## 2019-12-09 DIAGNOSIS — N840 Polyp of corpus uteri: Secondary | ICD-10-CM | POA: Diagnosis not present

## 2019-12-09 DIAGNOSIS — J45909 Unspecified asthma, uncomplicated: Secondary | ICD-10-CM | POA: Insufficient documentation

## 2019-12-09 DIAGNOSIS — N858 Other specified noninflammatory disorders of uterus: Secondary | ICD-10-CM | POA: Diagnosis not present

## 2019-12-09 HISTORY — PX: HYSTEROSCOPY WITH D & C: SHX1775

## 2019-12-09 HISTORY — DX: Tachycardia, unspecified: R00.0

## 2019-12-09 LAB — TYPE AND SCREEN
ABO/RH(D): A POS
Antibody Screen: NEGATIVE

## 2019-12-09 LAB — POCT PREGNANCY, URINE: Preg Test, Ur: NEGATIVE

## 2019-12-09 SURGERY — DILATATION AND CURETTAGE /HYSTEROSCOPY
Anesthesia: General

## 2019-12-09 MED ORDER — MIDAZOLAM HCL 2 MG/2ML IJ SOLN
INTRAMUSCULAR | Status: AC
  Filled 2019-12-09: qty 2

## 2019-12-09 MED ORDER — LIDOCAINE HCL (CARDIAC) PF 100 MG/5ML IV SOSY
PREFILLED_SYRINGE | INTRAVENOUS | Status: DC | PRN
  Administered 2019-12-09: 40 mg via INTRAVENOUS

## 2019-12-09 MED ORDER — LACTATED RINGERS IV SOLN: INTRAVENOUS | Status: DC

## 2019-12-09 MED ORDER — FENTANYL CITRATE (PF) 100 MCG/2ML IJ SOLN
INTRAMUSCULAR | Status: DC | PRN
  Administered 2019-12-09 (×2): 25 ug via INTRAVENOUS

## 2019-12-09 MED ORDER — CEFAZOLIN SODIUM-DEXTROSE 2-4 GM/100ML-% IV SOLN
INTRAVENOUS | Status: AC
  Filled 2019-12-09: qty 100

## 2019-12-09 MED ORDER — CEFAZOLIN SODIUM-DEXTROSE 2-4 GM/100ML-% IV SOLN
2.0000 g | Freq: Once | INTRAVENOUS | Status: AC
  Administered 2019-12-09: 2 g via INTRAVENOUS

## 2019-12-09 MED ORDER — PROPOFOL 10 MG/ML IV BOLUS
INTRAVENOUS | Status: AC
  Filled 2019-12-09: qty 20

## 2019-12-09 MED ORDER — ONDANSETRON HCL 4 MG/2ML IJ SOLN
INTRAMUSCULAR | Status: DC | PRN
  Administered 2019-12-09: 4 mg via INTRAVENOUS

## 2019-12-09 MED ORDER — GABAPENTIN 300 MG PO CAPS
ORAL_CAPSULE | ORAL | Status: AC
  Administered 2019-12-09: 300 mg via ORAL
  Filled 2019-12-09: qty 1

## 2019-12-09 MED ORDER — EPHEDRINE SULFATE 50 MG/ML IJ SOLN
INTRAMUSCULAR | Status: AC
  Filled 2019-12-09: qty 1

## 2019-12-09 MED ORDER — FENTANYL CITRATE (PF) 100 MCG/2ML IJ SOLN
INTRAMUSCULAR | Status: AC
  Filled 2019-12-09: qty 2

## 2019-12-09 MED ORDER — ONDANSETRON HCL 4 MG/2ML IJ SOLN: 4.0000 mg | Freq: Once | INTRAMUSCULAR | Status: DC | PRN

## 2019-12-09 MED ORDER — FAMOTIDINE 20 MG PO TABS
ORAL_TABLET | ORAL | Status: AC
  Administered 2019-12-09: 20 mg via ORAL
  Filled 2019-12-09: qty 1

## 2019-12-09 MED ORDER — SILVER NITRATE-POT NITRATE 75-25 % EX MISC
CUTANEOUS | Status: AC
  Filled 2019-12-09: qty 10

## 2019-12-09 MED ORDER — ACETAMINOPHEN 500 MG PO TABS
ORAL_TABLET | ORAL | Status: AC
  Administered 2019-12-09: 13:00:00 1000 mg via ORAL
  Filled 2019-12-09: qty 2

## 2019-12-09 MED ORDER — KETOROLAC TROMETHAMINE 30 MG/ML IJ SOLN
INTRAMUSCULAR | Status: DC | PRN
  Administered 2019-12-09: 30 mg via INTRAVENOUS

## 2019-12-09 MED ORDER — PROPOFOL 10 MG/ML IV BOLUS
INTRAVENOUS | Status: DC | PRN
  Administered 2019-12-09: 200 mg via INTRAVENOUS

## 2019-12-09 MED ORDER — FAMOTIDINE 20 MG PO TABS: 20.0000 mg | ORAL_TABLET | Freq: Once | ORAL | Status: AC

## 2019-12-09 MED ORDER — MIDAZOLAM HCL 2 MG/2ML IJ SOLN
INTRAMUSCULAR | Status: DC | PRN
  Administered 2019-12-09: 2 mg via INTRAVENOUS

## 2019-12-09 MED ORDER — FENTANYL CITRATE (PF) 100 MCG/2ML IJ SOLN: 25.0000 ug | INTRAMUSCULAR | Status: DC | PRN

## 2019-12-09 MED ORDER — GABAPENTIN 300 MG PO CAPS: 300.0000 mg | ORAL_CAPSULE | ORAL | Status: AC

## 2019-12-09 MED ORDER — DEXAMETHASONE SODIUM PHOSPHATE 10 MG/ML IJ SOLN
INTRAMUSCULAR | Status: DC | PRN
  Administered 2019-12-09: 10 mg via INTRAVENOUS

## 2019-12-09 MED ORDER — PHENYLEPHRINE HCL (PRESSORS) 10 MG/ML IV SOLN
INTRAVENOUS | Status: DC | PRN
  Administered 2019-12-09 (×2): 100 ug via INTRAVENOUS

## 2019-12-09 MED ORDER — ACETAMINOPHEN 500 MG PO TABS: 1000.0000 mg | ORAL_TABLET | ORAL | Status: AC

## 2019-12-09 SURGICAL SUPPLY — 21 items
CANISTER SUCT 3000ML PPV (MISCELLANEOUS) ×3 IMPLANT
CATH ROBINSON RED A/P 16FR (CATHETERS) IMPLANT
COVER WAND RF STERILE (DRAPES) IMPLANT
DEVICE MYOSURE LITE (MISCELLANEOUS) IMPLANT
DEVICE MYOSURE REACH (MISCELLANEOUS) IMPLANT
GLOVE BIO SURGEON STRL SZ8 (GLOVE) ×3 IMPLANT
GOWN STRL REUS W/ TWL LRG LVL3 (GOWN DISPOSABLE) ×2 IMPLANT
GOWN STRL REUS W/ TWL XL LVL3 (GOWN DISPOSABLE) ×2 IMPLANT
GOWN STRL REUS W/TWL LRG LVL3 (GOWN DISPOSABLE) ×3
GOWN STRL REUS W/TWL XL LVL3 (GOWN DISPOSABLE) ×3
KIT PROCEDURE FLUENT (KITS) ×3 IMPLANT
KIT TURNOVER CYSTO (KITS) ×3 IMPLANT
PACK DNC HYST (MISCELLANEOUS) IMPLANT
PAD OB MATERNITY 4.3X12.25 (PERSONAL CARE ITEMS) ×3 IMPLANT
PAD PREP 24X41 OB/GYN DISP (PERSONAL CARE ITEMS) ×3 IMPLANT
SEAL ROD LENS SCOPE MYOSURE (ABLATOR) ×3 IMPLANT
SOL .9 NS 3000ML IRR  AL (IV SOLUTION) ×1
SOL .9 NS 3000ML IRR AL (IV SOLUTION) ×2
SOL .9 NS 3000ML IRR UROMATIC (IV SOLUTION) ×2 IMPLANT
TOWEL OR 17X26 4PK STRL BLUE (TOWEL DISPOSABLE) ×3 IMPLANT
TUBING CONNECTING 10 (TUBING) IMPLANT

## 2019-12-09 NOTE — Op Note (Signed)
NAME: Wendy Hunt, GWILLIAM MEDICAL RECORD AG:[id number] ACCOUNT [id number] DATE OF BIRTH:Nov 17, 1986 FACILITY: [facility] LOCATION: [facility] PHYSICIAN:Trustin Chapa Josefine Class, MD  OPERATIVE REPORT  DATE OF PROCEDURE:  12/09/2019  PREOPERATIVE DIAGNOSES: 1.  Menorrhagia. 2.  Endometrial polyp.  POSTOPERATIVE DIAGNOSIS:  Menorrhagia.  PROCEDURE: 1.  Fractional dilation and curettage. 2.  Hysteroscopy.  SURGEON:  Laverta Baltimore, MD  ANESTHESIA:  General endotracheal anesthesia.  INDICATIONS:  A 33 year old gravida 1, para 1 patient with a history of menorrhagia and a saline infusion sonohysterography that showed a 1.6 x 0.9 x 1.6 cm polyp.  The patient elects for future fertility.  DESCRIPTION OF PROCEDURE:  After adequate general endotracheal anesthesia, the patient was placed in dorsal supine position, legs in the candy cane stirrups.  The patient's abdomen, perineum and vagina were prepped and draped in normal sterile fashion.   She did receive 2 grams IV Ancef prior to commencement of the case.  Timeout was performed.  Straight catheterization of the bladder yielded 100 mL of clear urine.  A weighted speculum was placed in the posterior vaginal vault and the anterior cervix was  grasped with a single tooth tenaculum.  An endocervical curettage was performed with adequate tissue.  Cervix was then dilated to #18 Hanks dilator.  The uterus sounded to 10 cm.  Hysteroscope was advanced into the endometrial cavity.  There was no  evidence of the aforementioned endometrial polyp.  Therefore, the hysteroscope was removed and the endometrial curettage was performed.  Tissues will be sent to pathology for identification.  Good hemostasis was noted.  COMPLICATIONS:  There were no complications.  ESTIMATED BLOOD LOSS:  Minimal.  INTRAOPERATIVE FLUIDS:  600 mL.  URINE OUTPUT:  100 mL.  DISPOSITION:  The patient was taken to recovery room in good condition.  TN/NUANCE  D:12/09/2019  T:12/09/2019 JOB:[id number]

## 2019-12-09 NOTE — Anesthesia Post-op Follow-up Note (Signed)
Anesthesia QCDR form completed.        

## 2019-12-09 NOTE — Progress Notes (Signed)
Pt ready fro D+C , H/S and myosure resection .  All questions answered . Labs reviewed . Proceed

## 2019-12-09 NOTE — Transfer of Care (Signed)
Immediate Anesthesia Transfer of Care Note  Patient: Wendy Hunt  Procedure(s) Performed: DILATATION AND CURETTAGE /HYSTEROSCOPY  Patient Location: PACU  Anesthesia Type:General  Level of Consciousness: sedated  Airway & Oxygen Therapy: Patient Spontanous Breathing and Patient connected to face mask oxygen  Post-op Assessment: Report given to RN and Post -op Vital signs reviewed and stable  Post vital signs: Reviewed and stable  Last Vitals:  Vitals Value Taken Time  BP 98/67 12/09/19 1558  Temp 36.1 C 12/09/19 1557  Pulse 87 12/09/19 1559  Resp 14 12/09/19 1559  SpO2 98 % 12/09/19 1559  Vitals shown include unvalidated device data.  Last Pain:  Vitals:   12/09/19 1557  TempSrc:   PainSc: Asleep         Complications: No apparent anesthesia complications

## 2019-12-09 NOTE — Brief Op Note (Signed)
12/09/2019  3:48 PM  PATIENT:  Feliberto Gottron  32 y.o. female  PRE-OPERATIVE DIAGNOSIS:  Endometrial Mass - Menorrhagia  POST-OPERATIVE DIAGNOSIS:  Endometrial Mass - Menorrhagia  PROCEDURE:  Procedure(s): DILATATION AND CURETTAGE /HYSTEROSCOPY  SURGEON:  Surgeon(s) and Role:    * Ross Hefferan, Gwen Her, MD - Primary  PHYSICIAN ASSISTANT:   ASSISTANTS: none   ANESTHESIA:   general  EBL:  10 mL   BLOOD ADMINISTERED:none  DRAINS: none   LOCAL MEDICATIONS USED:  NONE  SPECIMEN:  Source of Specimen:  ecc, endometrial curettings   DISPOSITION OF SPECIMEN:  PATHOLOGY  COUNTS:  YES  TOURNIQUET:  * No tourniquets in log *  DICTATION: .Other Dictation: Dictation Number verbal  PLAN OF CARE: Discharge to home after PACU  PATIENT DISPOSITION:  PACU - hemodynamically stable.   Delay start of Pharmacological VTE agent (>24hrs) due to surgical blood loss or risk of bleeding: not applicable

## 2019-12-09 NOTE — Anesthesia Procedure Notes (Signed)
Procedure Name: LMA Insertion Date/Time: 12/09/2019 3:12 PM Performed by: Jerrye Noble, CRNA Pre-anesthesia Checklist: Patient identified, Emergency Drugs available, Suction available and Patient being monitored Patient Re-evaluated:Patient Re-evaluated prior to induction Oxygen Delivery Method: Circle system utilized Preoxygenation: Pre-oxygenation with 100% oxygen Induction Type: IV induction Ventilation: Mask ventilation without difficulty LMA: LMA inserted LMA Size: 4.0 Number of attempts: 1 Placement Confirmation: positive ETCO2 Tube secured with: Tape Dental Injury: Teeth and Oropharynx as per pre-operative assessment

## 2019-12-09 NOTE — Anesthesia Postprocedure Evaluation (Signed)
Anesthesia Post Note  Patient: Wendy Hunt  Procedure(s) Performed: DILATATION AND CURETTAGE /HYSTEROSCOPY  Patient location during evaluation: PACU Anesthesia Type: General Level of consciousness: awake and alert Pain management: pain level controlled Vital Signs Assessment: post-procedure vital signs reviewed and stable Respiratory status: spontaneous breathing, nonlabored ventilation, respiratory function stable and patient connected to nasal cannula oxygen Cardiovascular status: blood pressure returned to baseline and stable Postop Assessment: no apparent nausea or vomiting Anesthetic complications: no     Last Vitals:  Vitals:   12/09/19 1557 12/09/19 1627  BP: 98/67 109/76  Pulse: 90 87  Resp: (!) 21 14  Temp: (!) 36.1 C   SpO2: 98% 100%    Last Pain:  Vitals:   12/09/19 1627  TempSrc:   PainSc: 0-No pain                 Eulis Salazar S

## 2019-12-09 NOTE — Anesthesia Preprocedure Evaluation (Addendum)
Anesthesia Evaluation  Patient identified by MRN, date of birth, ID band Patient awake    Reviewed: Allergy & Precautions, NPO status , Patient's Chart, lab work & pertinent test results  Airway Mallampati: II  TM Distance: >3 FB     Dental  (+) Teeth Intact   Pulmonary asthma , former smoker,    Pulmonary exam normal        Cardiovascular negative cardio ROS Normal cardiovascular exam     Neuro/Psych negative neurological ROS  negative psych ROS   GI/Hepatic negative GI ROS, Neg liver ROS,   Endo/Other  negative endocrine ROS  Renal/GU negative Renal ROS  Female GU complaint     Musculoskeletal negative musculoskeletal ROS (+)   Abdominal Normal abdominal exam  (+)   Peds negative pediatric ROS (+)  Hematology  (+) anemia ,   Anesthesia Other Findings   Reproductive/Obstetrics                            Anesthesia Physical Anesthesia Plan  ASA: II  Anesthesia Plan: General   Post-op Pain Management:    Induction: Intravenous  PONV Risk Score and Plan:   Airway Management Planned: LMA  Additional Equipment:   Intra-op Plan:   Post-operative Plan: Extubation in OR  Informed Consent: I have reviewed the patients History and Physical, chart, labs and discussed the procedure including the risks, benefits and alternatives for the proposed anesthesia with the patient or authorized representative who has indicated his/her understanding and acceptance.     Dental advisory given  Plan Discussed with: CRNA and Surgeon  Anesthesia Plan Comments:         Anesthesia Quick Evaluation

## 2019-12-10 NOTE — Discharge Instructions (Signed)
Hysteroscopy, Care After °This sheet gives you information about how to care for yourself after your procedure. Your health care provider may also give you more specific instructions. If you have problems or questions, contact your health care provider. °What can I expect after the procedure? °After the procedure, it is common to have: °· Cramping. °· Bleeding. This can vary from light spotting to menstrual-like bleeding. °Follow these instructions at home: °Activity °· Rest for 1-2 days after the procedure. °· Do not douche, use tampons, or have sex for 2 weeks after the procedure, or until your health care provider approves. °· Do not drive for 24 hours after the procedure, or for as long as told by your health care provider. °· Do not drive, use heavy machinery, or drink alcohol while taking prescription pain medicines. °Medicines ° °· Take over-the-counter and prescription medicines only as told by your health care provider. °· Do not take aspirin during recovery. It can increase the risk of bleeding. °General instructions °· Do not take baths, swim, or use a hot tub until your health care provider approves. Take showers instead of baths for 2 weeks, or for as long as told by your health care provider. °· To prevent or treat constipation while you are taking prescription pain medicine, your health care provider may recommend that you: °? Drink enough fluid to keep your urine clear or pale yellow. °? Take over-the-counter or prescription medicines. °? Eat foods that are high in fiber, such as fresh fruits and vegetables, whole grains, and beans. °? Limit foods that are high in fat and processed sugars, such as fried and sweet foods. °· Keep all follow-up visits as told by your health care provider. This is important. °Contact a health care provider if: °· You feel dizzy or lightheaded. °· You feel nauseous. °· You have abnormal vaginal discharge. °· You have a rash. °· You have pain that does not get better with  medicine. °· You have chills. °Get help right away if: °· You have bleeding that is heavier than a normal menstrual period. °· You have a fever. °· You have pain or cramps that get worse. °· You develop new abdominal pain. °· You faint. °· You have pain in your shoulders. °· You have shortness of breath. °Summary °· After the procedure, you may have cramping and some vaginal bleeding. °· Do not douche, use tampons, or have sex for 2 weeks after the procedure, or until your health care provider approves. °· Do not take baths, swim, or use a hot tub until your health care provider approves. Take showers instead of baths for 2 weeks, or for as long as told by your health care provider. °· Report any unusual symptoms to your health care provider. °· Keep all follow-up visits as told by your health care provider. This is important. °This information is not intended to replace advice given to you by your health care provider. Make sure you discuss any questions you have with your health care provider. °Document Released: 09/25/2013 Document Revised: 11/17/2017 Document Reviewed: 01/03/2017 °Elsevier Patient Education © 2020 [employer] ° °AMBULATORY SURGERY  °DISCHARGE INSTRUCTIONS ° ° °1) The drugs that you were given will stay in your system until tomorrow so for the next 24 hours you should not: ° °A) Drive an automobile °B) Make any legal decisions °C) Drink any alcoholic beverage ° ° °2) You may resume regular meals tomorrow.  Today it is better to start with liquids and gradually work   up to solid foods. ° °You may eat anything you prefer, but it is better to start with liquids, then soup and crackers, and gradually work up to solid foods. ° ° °3) Please notify your doctor immediately if you have any unusual bleeding, trouble breathing, redness and pain at the surgery site, drainage, fever, or pain not relieved by medication. ° ° ° °4) Additional Instructions: ° ° ° ° ° ° ° °Please contact your physician with any  problems or Same Day Surgery at [phone], Monday through Friday 6 am to 4 pm, or [facility] Main number at [phone]. ° °

## 2019-12-11 LAB — SURGICAL PATHOLOGY

## 2019-12-24 DIAGNOSIS — Z3009 Encounter for other general counseling and advice on contraception: Secondary | ICD-10-CM | POA: Diagnosis not present

## 2019-12-24 DIAGNOSIS — Z9889 Other specified postprocedural states: Secondary | ICD-10-CM | POA: Diagnosis not present

## 2020-03-09 ENCOUNTER — Ambulatory Visit: Payer: Managed Care, Other (non HMO) | Attending: Family

## 2020-03-09 ENCOUNTER — Encounter: Payer: Self-pay | Admitting: Physician Assistant

## 2020-03-09 ENCOUNTER — Telehealth (INDEPENDENT_AMBULATORY_CARE_PROVIDER_SITE_OTHER): Payer: 59 | Admitting: Physician Assistant

## 2020-03-09 VITALS — Temp 99.5°F

## 2020-03-09 DIAGNOSIS — Z20822 Contact with and (suspected) exposure to covid-19: Secondary | ICD-10-CM | POA: Diagnosis not present

## 2020-03-09 DIAGNOSIS — J069 Acute upper respiratory infection, unspecified: Secondary | ICD-10-CM

## 2020-03-09 NOTE — Progress Notes (Signed)
Patient: Wendy Hunt Female    DOB: [birth year]   34 y.o.   MRN: [id number] Visit Date: 03/09/2020  Today's Provider: Margaretann Loveless, PA-C   Chief Complaint  Patient presents with  . URI   Subjective:    Virtual Visit via Video Note  I connected with Wendy Hunt on 03/09/20 at 11:20 AM EDT by a video enabled telemedicine application and verified that I am speaking with the correct person using two identifiers.  Location: Patient: Home Provider: [facility]   I discussed the limitations of evaluation and management by telemedicine and the availability of in person appointments. The patient expressed understanding and agreed to proceed.  URI  This is a new problem. The current episode started yesterday. The problem has been unchanged. Maximum temperature: 99.5 for the past 2 days. Associated symptoms include congestion, coughing, ear pain (yesterday), sinus pain, sneezing and a sore throat. Pertinent negatives include no abdominal pain or wheezing. Associated symptoms comments: Body ache; has not lost sense of smell or taste-smell decreased due to congestion. She has tried nothing for the symptoms.  Unknown if exposed to Covid 19. Was tested this morning.    Allergies  Allergen Reactions  . Lidocaine-Epinephrine Rash    Skin irritation   . Sulfa Antibiotics Rash     Current Outpatient Medications:  .  albuterol (VENTOLIN HFA) 108 (90 Base) MCG/ACT inhaler, Inhale 1-2 puffs into the lungs every 6 (six) hours as needed for wheezing or shortness of breath., Disp: 18 g, Rfl: 1 .  ferrous sulfate 325 (65 FE) MG tablet, Take 325 mg by mouth daily with breakfast., Disp: , Rfl:  .  vitamin C (ASCORBIC ACID) 500 MG tablet, Take 500 mg by mouth daily., Disp: , Rfl:  .  phentermine (ADIPEX-P) 37.5 MG tablet, Take 1 tablet (37.5 mg total) by mouth daily before breakfast. (Patient not taking: Reported on 03/09/2020), Disp: 30 tablet, Rfl: 2  Review of Systems  Constitutional:  Positive for fatigue. Negative for fever (low grade 99).  HENT: Positive for congestion, ear pain (yesterday), sinus pressure (started today), sinus pain, sneezing and sore throat.   Respiratory: Positive for cough. Negative for chest tightness, shortness of breath and wheezing.   Cardiovascular: Negative.   Gastrointestinal: Negative for abdominal pain.    Social History   Tobacco Use  . Smoking status: Former Smoker    Packs/day: 0.50    Years: 8.00    Pack years: 4.00    Quit date: 12/19/2011    Years since quitting: 8.2  . Smokeless tobacco: Never Used  Substance Use Topics  . Alcohol use: Yes    Comment: SOCIAL      Objective:   Temp 99.5 F (37.5 C) (Oral)  Vitals:   03/09/20 1056  Temp: 99.5 F (37.5 C)  TempSrc: Oral  There is no height or weight on file to calculate BMI.   Physical Exam Vitals reviewed.  Constitutional:      General: She is not in acute distress.    Appearance: Normal appearance. She is well-developed. She is ill-appearing.  HENT:     Head: Normocephalic and atraumatic.  Pulmonary:     Effort: Pulmonary effort is normal. No respiratory distress.  Musculoskeletal:     Cervical back: Normal range of motion and neck supple.  Neurological:     Mental Status: She is alert.  Psychiatric:        Mood and Affect: Mood normal.  Behavior: Behavior normal.        Thought Content: Thought content normal.        Judgment: Judgment normal.      No results found for any visits on 03/09/20.     Assessment & Plan     1. Viral upper respiratory tract infection Awaiting covid results. Continue symptomatic relief medications of choice. Push fluids. Work note provided.  - MyChart COVID-19 home monitoring program; Future - Temperature monitoring; Future   I discussed the assessment and treatment plan with the patient. The patient was provided an opportunity to ask questions and all were answered. The patient agreed with the plan and  demonstrated an understanding of the instructions.   The patient was advised to call back or seek an in-person evaluation if the symptoms worsen or if the condition fails to improve as anticipated.  I provided 8 minutes of non-face-to-face time during this encounter.    Mar Daring, PA-C  [facility]

## 2020-03-09 NOTE — Patient Instructions (Signed)
COVID-19 COVID-19 is a respiratory infection that is caused by a virus called severe acute respiratory syndrome coronavirus 2 (SARS-CoV-2). The disease is also known as coronavirus disease or novel coronavirus. In some people, the virus may not cause any symptoms. In others, it may cause a serious infection. The infection can get worse quickly and can lead to complications, such as:  Pneumonia, or infection of the lungs.  Acute respiratory distress syndrome or ARDS. This is a condition in which fluid build-up in the lungs prevents the lungs from filling with air and passing oxygen into the blood.  Acute respiratory failure. This is a condition in which there is not enough oxygen passing from the lungs to the body or when carbon dioxide is not passing from the lungs out of the body.  Sepsis or septic shock. This is a serious bodily reaction to an infection.  Blood clotting problems.  Secondary infections due to bacteria or fungus.  Organ failure. This is when your body's organs stop working. The virus that causes COVID-19 is contagious. This means that it can spread from person to person through droplets from coughs and sneezes (respiratory secretions). What are the causes? This illness is caused by a virus. You may catch the virus by:  Breathing in droplets from an infected person. Droplets can be spread by a person breathing, speaking, singing, coughing, or sneezing.  Touching something, like a table or a doorknob, that was exposed to the virus (contaminated) and then touching your mouth, nose, or eyes. What increases the risk? Risk for infection You are more likely to be infected with this virus if you:  Are within 6 feet (2 meters) of a person with COVID-19.  Provide care for or live with a person who is infected with COVID-19.  Spend time in crowded indoor spaces or live in shared housing. Risk for serious illness You are more likely to become seriously ill from the virus if you:   Are 50 years of age or older. The higher your age, the more you are at risk for serious illness.  Live in a nursing home or long-term care facility.  Have cancer.  Have a long-term (chronic) disease such as: ? Chronic lung disease, including chronic obstructive pulmonary disease or asthma. ? A long-term disease that lowers your body's ability to fight infection (immunocompromised). ? Heart disease, including heart failure, a condition in which the arteries that lead to the heart become narrow or blocked (coronary artery disease), a disease which makes the heart muscle thick, weak, or stiff (cardiomyopathy). ? Diabetes. ? Chronic kidney disease. ? Sickle cell disease, a condition in which red blood cells have an abnormal "sickle" shape. ? Liver disease.  Are obese. What are the signs or symptoms? Symptoms of this condition can range from mild to severe. Symptoms may appear any time from 2 to 14 days after being exposed to the virus. They include:  A fever or chills.  A cough.  Difficulty breathing.  Headaches, body aches, or muscle aches.  Runny or stuffy (congested) nose.  A sore throat.  New loss of taste or smell. Some people may also have stomach problems, such as nausea, vomiting, or diarrhea. Other people may not have any symptoms of COVID-19. How is this diagnosed? This condition may be diagnosed based on:  Your signs and symptoms, especially if: ? You live in an area with a COVID-19 outbreak. ? You recently traveled to or from an area where the virus is common. ? You   provide care for or live with a person who was diagnosed with COVID-19. ? You were exposed to a person who was diagnosed with COVID-19.  A physical exam.  Lab tests, which may include: ? Taking a sample of fluid from the back of your nose and throat (nasopharyngeal fluid), your nose, or your throat using a swab. ? A sample of mucus from your lungs (sputum). ? Blood tests.  Imaging tests, which  may include, X-rays, CT scan, or ultrasound. How is this treated? At present, there is no medicine to treat COVID-19. Medicines that treat other diseases are being used on a trial basis to see if they are effective against COVID-19. Your health care provider will talk with you about ways to treat your symptoms. For most people, the infection is mild and can be managed at home with rest, fluids, and over-the-counter medicines. Treatment for a serious infection usually takes places in a hospital intensive care unit (ICU). It may include one or more of the following treatments. These treatments are given until your symptoms improve.  Receiving fluids and medicines through an IV.  Supplemental oxygen. Extra oxygen is given through a tube in the nose, a face mask, or a hood.  Positioning you to lie on your stomach (prone position). This makes it easier for oxygen to get into the lungs.  Continuous positive airway pressure (CPAP) or bi-level positive airway pressure (BPAP) machine. This treatment uses mild air pressure to keep the airways open. A tube that is connected to a motor delivers oxygen to the body.  Ventilator. This treatment moves air into and out of the lungs by using a tube that is placed in your windpipe.  Tracheostomy. This is a procedure to create a hole in the neck so that a breathing tube can be inserted.  Extracorporeal membrane oxygenation (ECMO). This procedure gives the lungs a chance to recover by taking over the functions of the heart and lungs. It supplies oxygen to the body and removes carbon dioxide. Follow these instructions at home: Lifestyle  If you are sick, stay home except to get medical care. Your health care provider will tell you how long to stay home. Call your health care provider before you go for medical care.  Rest at home as told by your health care provider.  Do not use any products that contain nicotine or tobacco, such as cigarettes, e-cigarettes, and  chewing tobacco. If you need help quitting, ask your health care provider.  Return to your normal activities as told by your health care provider. Ask your health care provider what activities are safe for you. General instructions  Take over-the-counter and prescription medicines only as told by your health care provider.  Drink enough fluid to keep your urine pale yellow.  Keep all follow-up visits as told by your health care provider. This is important. How is this prevented?  There is no vaccine to help prevent COVID-19 infection. However, there are steps you can take to protect yourself and others from this virus. To protect yourself:   Do not travel to areas where COVID-19 is a risk. The areas where COVID-19 is reported change often. To identify high-risk areas and travel restrictions, check the CDC travel website: wwwnc.cdc.gov/travel/notices  If you live in, or must travel to, an area where COVID-19 is a risk, take precautions to avoid infection. ? Stay away from people who are sick. ? Wash your hands often with soap and water for 20 seconds. If soap and water   are not available, use an alcohol-based hand sanitizer. ? Avoid touching your mouth, face, eyes, or nose. ? Avoid going out in public, follow guidance from your state and local health authorities. ? If you must go out in public, wear a cloth face covering or face mask. Make sure your mask covers your nose and mouth. ? Avoid crowded indoor spaces. Stay at least 6 feet (2 meters) away from others. ? Disinfect objects and surfaces that are frequently touched every day. This may include:  Counters and tables.  Doorknobs and light switches.  Sinks and faucets.  Electronics, such as phones, remote controls, keyboards, computers, and tablets. To protect others: If you have symptoms of COVID-19, take steps to prevent the virus from spreading to others.  If you think you have a COVID-19 infection, contact your health care  provider right away. Tell your health care team that you think you may have a COVID-19 infection.  Stay home. Leave your house only to seek medical care. Do not use public transport.  Do not travel while you are sick.  Wash your hands often with soap and water for 20 seconds. If soap and water are not available, use alcohol-based hand sanitizer.  Stay away from other members of your household. Let healthy household members care for children and pets, if possible. If you have to care for children or pets, wash your hands often and wear a mask. If possible, stay in your own room, separate from others. Use a different bathroom.  Make sure that all people in your household wash their hands well and often.  Cough or sneeze into a tissue or your sleeve or elbow. Do not cough or sneeze into your hand or into the air.  Wear a cloth face covering or face mask. Make sure your mask covers your nose and mouth. Where to find more information  Centers for Disease Control and Prevention: www.cdc.gov/coronavirus/2019-ncov/index.html  World Health Organization: www.who.int/health-topics/coronavirus Contact a health care provider if:  You live in or have traveled to an area where COVID-19 is a risk and you have symptoms of the infection.  You have had contact with someone who has COVID-19 and you have symptoms of the infection. Get help right away if:  You have trouble breathing.  You have pain or pressure in your chest.  You have confusion.  You have bluish lips and fingernails.  You have difficulty waking from sleep.  You have symptoms that get worse. These symptoms may represent a serious problem that is an emergency. Do not wait to see if the symptoms will go away. Get medical help right away. Call your local emergency services (911 in the [place] Do not drive yourself to the hospital. Let the emergency medical personnel know if you think you have COVID-19. Summary  COVID-19 is a  respiratory infection that is caused by a virus. It is also known as coronavirus disease or novel coronavirus. It can cause serious infections, such as pneumonia, acute respiratory distress syndrome, acute respiratory failure, or sepsis.  The virus that causes COVID-19 is contagious. This means that it can spread from person to person through droplets from breathing, speaking, singing, coughing, or sneezing.  You are more likely to develop a serious illness if you are 50 years of age or older, have a weak immune system, live in a nursing home, or have chronic disease.  There is no medicine to treat COVID-19. Your health care provider will talk with you about ways to treat your symptoms.    Take steps to protect yourself and others from infection. Wash your hands often and disinfect objects and surfaces that are frequently touched every day. Stay away from people who are sick and wear a mask if you are sick. This information is not intended to replace advice given to you by your health care provider. Make sure you discuss any questions you have with your health care provider. Document Revised: 10/04/2019 Document Reviewed: 01/10/2019 Elsevier Patient Education  2020 [employer]  

## 2020-03-10 ENCOUNTER — Encounter (INDEPENDENT_AMBULATORY_CARE_PROVIDER_SITE_OTHER): Payer: Self-pay

## 2020-03-10 LAB — SARS-COV-2, NAA 2 DAY TAT

## 2020-03-10 LAB — NOVEL CORONAVIRUS, NAA: SARS-CoV-2, NAA: DETECTED — AB

## 2020-03-11 ENCOUNTER — Encounter (INDEPENDENT_AMBULATORY_CARE_PROVIDER_SITE_OTHER): Payer: Self-pay

## 2020-03-13 ENCOUNTER — Encounter (INDEPENDENT_AMBULATORY_CARE_PROVIDER_SITE_OTHER): Payer: Self-pay

## 2020-03-14 ENCOUNTER — Encounter (INDEPENDENT_AMBULATORY_CARE_PROVIDER_SITE_OTHER): Payer: Self-pay

## 2020-03-15 ENCOUNTER — Encounter (INDEPENDENT_AMBULATORY_CARE_PROVIDER_SITE_OTHER): Payer: Self-pay

## 2020-03-16 ENCOUNTER — Encounter (INDEPENDENT_AMBULATORY_CARE_PROVIDER_SITE_OTHER): Payer: Self-pay

## 2020-03-17 ENCOUNTER — Encounter (INDEPENDENT_AMBULATORY_CARE_PROVIDER_SITE_OTHER): Payer: Self-pay

## 2020-03-19 ENCOUNTER — Encounter (INDEPENDENT_AMBULATORY_CARE_PROVIDER_SITE_OTHER): Payer: Self-pay

## 2020-05-21 ENCOUNTER — Encounter: Payer: Medicaid Other | Admitting: Physician Assistant

## 2020-05-22 ENCOUNTER — Other Ambulatory Visit: Payer: Self-pay

## 2020-05-22 ENCOUNTER — Ambulatory Visit (INDEPENDENT_AMBULATORY_CARE_PROVIDER_SITE_OTHER): Payer: 59 | Admitting: Physician Assistant

## 2020-05-22 ENCOUNTER — Encounter: Payer: Self-pay | Admitting: Physician Assistant

## 2020-05-22 VITALS — BP 126/88 | HR 91 | Temp 96.9°F | Resp 16 | Ht 59.0 in | Wt 148.4 lb

## 2020-05-22 DIAGNOSIS — N6320 Unspecified lump in the left breast, unspecified quadrant: Secondary | ICD-10-CM | POA: Diagnosis not present

## 2020-05-22 DIAGNOSIS — Z0000 Encounter for general adult medical examination without abnormal findings: Secondary | ICD-10-CM | POA: Diagnosis not present

## 2020-05-22 DIAGNOSIS — Z803 Family history of malignant neoplasm of breast: Secondary | ICD-10-CM | POA: Diagnosis not present

## 2020-05-22 DIAGNOSIS — Z8616 Personal history of COVID-19: Secondary | ICD-10-CM

## 2020-05-22 DIAGNOSIS — Z713 Dietary counseling and surveillance: Secondary | ICD-10-CM | POA: Diagnosis not present

## 2020-05-22 MED ORDER — PHENTERMINE HCL 37.5 MG PO TABS: 37.5000 mg | ORAL_TABLET | Freq: Every day | ORAL | 2 refills | Status: DC

## 2020-05-22 NOTE — Patient Instructions (Signed)
Health Maintenance, Female Adopting a healthy lifestyle and getting preventive care are important in promoting health and wellness. Ask your health care provider about:  The right schedule for you to have regular tests and exams.  Things you can do on your own to prevent diseases and keep yourself healthy. What should I know about diet, weight, and exercise? Eat a healthy diet   Eat a diet that includes plenty of vegetables, fruits, low-fat dairy products, and lean protein.  Do not eat a lot of foods that are high in solid fats, added sugars, or sodium. Maintain a healthy weight Body mass index (BMI) is used to identify weight problems. It estimates body fat based on height and weight. Your health care provider can help determine your BMI and help you achieve or maintain a healthy weight. Get regular exercise Get regular exercise. This is one of the most important things you can do for your health. Most adults should:  Exercise for at least 150 minutes each week. The exercise should increase your heart rate and make you sweat (moderate-intensity exercise).  Do strengthening exercises at least twice a week. This is in addition to the moderate-intensity exercise.  Spend less time sitting. Even light physical activity can be beneficial. Watch cholesterol and blood lipids Have your blood tested for lipids and cholesterol at 35 years of age, then have this test every 5 years. Have your cholesterol levels checked more often if:  Your lipid or cholesterol levels are high.  You are older than 35 years of age.  You are at high risk for heart disease. What should I know about cancer screening? Depending on your health history and family history, you may need to have cancer screening at various ages. This may include screening for:  Breast cancer.  Cervical cancer.  Colorectal cancer.  Skin cancer.  Lung cancer. What should I know about heart disease, diabetes, and high blood  pressure? Blood pressure and heart disease  High blood pressure causes heart disease and increases the risk of stroke. This is more likely to develop in people who have high blood pressure readings, are of African descent, or are overweight.  Have your blood pressure checked: ? Every 3-5 years if you are 18-39 years of age. ? Every year if you are 40 years old or older. Diabetes Have regular diabetes screenings. This checks your fasting blood sugar level. Have the screening done:  Once every three years after age 40 if you are at a normal weight and have a low risk for diabetes.  More often and at a younger age if you are overweight or have a high risk for diabetes. What should I know about preventing infection? Hepatitis B If you have a higher risk for hepatitis B, you should be screened for this virus. Talk with your health care provider to find out if you are at risk for hepatitis B infection. Hepatitis C Testing is recommended for:  Everyone born from 1945 through 1965.  Anyone with known risk factors for hepatitis C. Sexually transmitted infections (STIs)  Get screened for STIs, including gonorrhea and chlamydia, if: ? You are sexually active and are younger than 35 years of age. ? You are older than 35 years of age and your health care provider tells you that you are at risk for this type of infection. ? Your sexual activity has changed since you were last screened, and you are at increased risk for chlamydia or gonorrhea. Ask your health care provider if   you are at risk.  Ask your health care provider about whether you are at high risk for HIV. Your health care provider may recommend a prescription medicine to help prevent HIV infection. If you choose to take medicine to prevent HIV, you should first get tested for HIV. You should then be tested every 3 months for as long as you are taking the medicine. Pregnancy  If you are about to stop having your period (premenopausal) and  you may become pregnant, seek counseling before you get pregnant.  Take 400 to 800 micrograms (mcg) of folic acid every day if you become pregnant.  Ask for birth control (contraception) if you want to prevent pregnancy. Osteoporosis and menopause Osteoporosis is a disease in which the bones lose minerals and strength with aging. This can result in bone fractures. If you are 65 years old or older, or if you are at risk for osteoporosis and fractures, ask your health care provider if you should:  Be screened for bone loss.  Take a calcium or vitamin D supplement to lower your risk of fractures.  Be given hormone replacement therapy (HRT) to treat symptoms of menopause. Follow these instructions at home: Lifestyle  Do not use any products that contain nicotine or tobacco, such as cigarettes, e-cigarettes, and chewing tobacco. If you need help quitting, ask your health care provider.  Do not use street drugs.  Do not share needles.  Ask your health care provider for help if you need support or information about quitting drugs. Alcohol use  Do not drink alcohol if: ? Your health care provider tells you not to drink. ? You are pregnant, may be pregnant, or are planning to become pregnant.  If you drink alcohol: ? Limit how much you use to 0-1 drink a day. ? Limit intake if you are breastfeeding.  Be aware of how much alcohol is in your drink. In the [place] one drink equals one 12 oz bottle of beer (355 mL), one 5 oz glass of wine (148 mL), or one 1 oz glass of hard liquor (44 mL). General instructions  Schedule regular health, dental, and eye exams.  Stay current with your vaccines.  Tell your health care provider if: ? You often feel depressed. ? You have ever been abused or do not feel safe at home. Summary  Adopting a healthy lifestyle and getting preventive care are important in promoting health and wellness.  Follow your health care provider's instructions about healthy  diet, exercising, and getting tested or screened for diseases.  Follow your health care provider's instructions on monitoring your cholesterol and blood pressure. This information is not intended to replace advice given to you by your health care provider. Make sure you discuss any questions you have with your health care provider. Document Revised: 11/28/2018 Document Reviewed: 11/28/2018 [employer] Patient Education  2020 [employer]  

## 2020-05-22 NOTE — Progress Notes (Signed)
Complete physical exam   Patient: Wendy Hunt   DOB: 1986-04-25   34 y.o. Female  MRN: [id number] Visit Date: 05/22/2020  Today's healthcare provider: Margaretann Loveless, PA-C   Chief Complaint  Patient presents with  . Annual Exam   Subjective    Wendy Hunt is a 34 y.o. female who presents today for a complete physical exam.  She reports consuming a general diet. The patient does not participate in regular exercise at present. She generally feels well. She reports sleeping well. She does not have additional problems to discuss today.  HPI   Past Medical History:  Diagnosis Date  . Anemia   . Palpitations   . RAD (reactive airway disease) with wheezing   . Tachycardia    Past Surgical History:  Procedure Laterality Date  . CESAREAN SECTION  November 2013  . HYSTEROSCOPY WITH D & C  12/09/2019   Procedure: DILATATION AND CURETTAGE /HYSTEROSCOPY;  Surgeon: Schermerhorn, Ihor Austin, MD;  Location: [facility] ORS;  Service: Gynecology;;  . WISDOM TOOTH EXTRACTION  2003   Social History   Socioeconomic History  . Marital status: Married    Spouse name: Not on file  . Number of children: 1  . Years of education: College  . Highest education level: Not on file  Occupational History    Comment: Full-time    Employer: [employer]  Tobacco Use  . Smoking status: Former Smoker    Packs/day: 0.50    Years: 8.00    Pack years: 4.00    Quit date: 12/19/2011    Years since quitting: 8.4  . Smokeless tobacco: Never Used  Substance and Sexual Activity  . Alcohol use: Yes    Comment: SOCIAL  . Drug use: No  . Sexual activity: Never  Other Topics Concern  . Not on file  Social History Narrative  . Not on file   Social Determinants of Health   Financial Resource Strain:   . Difficulty of Paying Living Expenses:   Food Insecurity:   . Worried About Programme researcher, broadcasting/film/video in the Last Year:   . Barista in the Last Year:   Transportation Needs:   .  Freight forwarder (Medical):   Marland Kitchen Lack of Transportation (Non-Medical):   Physical Activity:   . Days of Exercise per Week:   . Minutes of Exercise per Session:   Stress:   . Feeling of Stress :   Social Connections:   . Frequency of Communication with Friends and Family:   . Frequency of Social Gatherings with Friends and Family:   . Attends Religious Services:   . Active Member of Clubs or Organizations:   . Attends Banker Meetings:   Marland Kitchen Marital Status:   Intimate Partner Violence:   . Fear of Current or Ex-Partner:   . Emotionally Abused:   Marland Kitchen Physically Abused:   . Sexually Abused:    Family Status  Relation Name Status  . Mother  Alive  . Father  Alive  . MGF  (Not Specified)  . PGM  (Not Specified)  . PGF  (Not Specified)  . Other mggm (Not Specified)  . MGM  (Not Specified)   Family History  Problem Relation Age of Onset  . Healthy Mother   . Hyperlipidemia Mother   . Healthy Father        Pre-DM and hypochondriac  . Diabetes Maternal Grandfather   . Diabetes Paternal Grandmother   .  Breast cancer Paternal Grandmother   . Esophageal cancer Paternal Grandfather   . Diabetes Other   . Glaucoma Other   . Breast cancer Other   . Angina Maternal Grandmother    Allergies  Allergen Reactions  . Lidocaine-Epinephrine Rash    Skin irritation   . Sulfa Antibiotics Rash    Patient Care Team: Mar Daring, PA-C as PCP - General (Family Medicine)   Medications: Outpatient Medications Prior to Visit  Medication Sig  . albuterol (VENTOLIN HFA) 108 (90 Base) MCG/ACT inhaler Inhale 1-2 puffs into the lungs every 6 (six) hours as needed for wheezing or shortness of breath.  . ferrous sulfate 325 (65 FE) MG tablet Take 325 mg by mouth daily with breakfast.  . vitamin C (ASCORBIC ACID) 500 MG tablet Take 500 mg by mouth daily.  . phentermine (ADIPEX-P) 37.5 MG tablet Take 1 tablet (37.5 mg total) by mouth daily before breakfast. (Patient not  taking: Reported on 03/09/2020)   No facility-administered medications prior to visit.    Review of Systems  Constitutional: Negative.   HENT: Negative.   Eyes: Positive for discharge and redness.  Respiratory: Positive for wheezing.   Cardiovascular: Positive for palpitations.  Gastrointestinal: Negative.   Endocrine: Negative.   Genitourinary: Negative.   Musculoskeletal: Negative.   Skin: Negative.   Allergic/Immunologic: Positive for environmental allergies.  Neurological: Negative.   Hematological: Negative.   Psychiatric/Behavioral: Negative.     Last CBC Lab Results  Component Value Date   WBC 8.1 12/05/2019   HGB 13.8 12/05/2019   HCT 42.8 12/05/2019   MCV 85.3 12/05/2019   MCH 27.5 12/05/2019   RDW 13.4 12/05/2019   PLT 278 19/41/7408   Last metabolic panel Lab Results  Component Value Date   GLUCOSE 93 12/05/2019   NA 141 12/05/2019   K 4.0 12/05/2019   CL 109 12/05/2019   CO2 23 12/05/2019   BUN 11 12/05/2019   CREATININE 0.80 12/05/2019   GFRNONAA >60 12/05/2019   GFRAA >60 12/05/2019   CALCIUM 9.1 12/05/2019   PROT 7.4 03/23/2018   ALBUMIN 4.8 03/23/2018   LABGLOB 2.6 03/23/2018   AGRATIO 1.8 03/23/2018   BILITOT 0.5 03/23/2018   ALKPHOS 47 03/23/2018   AST 17 03/23/2018   ALT 16 03/23/2018   ANIONGAP 9 12/05/2019   Last lipids No results found for: CHOL, HDL, LDLCALC, LDLDIRECT, TRIG, CHOLHDL Last hemoglobin A1c Lab Results  Component Value Date   HGBA1C 5.3 12/31/2015      Objective    BP 126/88 (BP Location: Left Arm, Patient Position: Sitting, Cuff Size: Large)   Pulse 91   Temp (!) 96.9 F (36.1 C) (Temporal)   Resp 16   Ht 4\' 11"  (1.499 m)   Wt 148 lb 6.4 oz (67.3 kg)   BMI 29.97 kg/m  BP Readings from Last 3 Encounters:  05/22/20 126/88  12/09/19 107/73  08/22/19 115/81   Wt Readings from Last 3 Encounters:  05/22/20 148 lb 6.4 oz (67.3 kg)  11/29/19 150 lb (68 kg)  08/22/19 151 lb (68.5 kg)      Physical  Exam Vitals reviewed.  Constitutional:      General: She is not in acute distress.    Appearance: Normal appearance. She is well-developed, well-groomed and overweight. She is not ill-appearing or diaphoretic.  HENT:     Head: Normocephalic and atraumatic.     Right Ear: External ear normal.     Left Ear: External ear normal.  Nose: Nose normal.     Mouth/Throat:     Mouth: Mucous membranes are moist.     Pharynx: Oropharynx is clear. No oropharyngeal exudate or posterior oropharyngeal erythema.  Eyes:     General: No scleral icterus.       Right eye: No discharge.        Left eye: No discharge.     Extraocular Movements: Extraocular movements intact.     Conjunctiva/sclera: Conjunctivae normal.     Pupils: Pupils are equal, round, and reactive to light.  Neck:     Thyroid: No thyromegaly.     Vascular: No JVD.     Trachea: No tracheal deviation.  Cardiovascular:     Rate and Rhythm: Normal rate and regular rhythm.     Pulses: Normal pulses.     Heart sounds: Normal heart sounds. No murmur. No friction rub. No gallop.   Pulmonary:     Effort: Pulmonary effort is normal. No respiratory distress.     Breath sounds: Normal breath sounds. No wheezing or rales.  Chest:     Chest wall: No tenderness.     Breasts:        Right: Normal. No inverted nipple, mass, nipple discharge, skin change or tenderness.        Left: Mass present. No inverted nipple, nipple discharge, skin change or tenderness.    Abdominal:     General: Abdomen is flat. Bowel sounds are normal. There is no distension.     Palpations: Abdomen is soft. There is no mass.     Tenderness: There is no abdominal tenderness. There is no guarding or rebound.  Musculoskeletal:        General: No tenderness. Normal range of motion.     Cervical back: Normal range of motion and neck supple.     Right lower leg: No edema.     Left lower leg: No edema.  Lymphadenopathy:     Cervical: No cervical adenopathy.     Upper  Body:     Right upper body: No supraclavicular, axillary or pectoral adenopathy.     Left upper body: No supraclavicular, axillary or pectoral adenopathy.  Skin:    General: Skin is warm and dry.     Capillary Refill: Capillary refill takes less than 2 seconds.     Findings: No rash.  Neurological:     General: No focal deficit present.     Mental Status: She is alert and oriented to person, place, and time. Mental status is at baseline.  Psychiatric:        Mood and Affect: Mood normal.        Behavior: Behavior normal. Behavior is cooperative.        Thought Content: Thought content normal.        Judgment: Judgment normal.       Depression Screen  PHQ 2/9 Scores 05/22/2020 05/01/2019 02/09/2017  PHQ - 2 Score 0 0 0    No results found for any visits on 05/22/20.  Assessment & Plan    Routine Health Maintenance and Physical Exam  Exercise Activities and Dietary recommendations Goals    . Exercise 150 minutes per week (moderate activity)       Immunization History  Administered Date(s) Administered  . HPV Quadrivalent 02/09/2011  . Influenza,inj,Quad PF,6+ Mos 10/22/2013, 08/28/2014, 12/31/2015, 01/19/2017, 10/20/2017, 10/05/2018, 10/02/2019    Health Maintenance  Topic Date Due  . COVID-19 Vaccine (1) Never done  . TETANUS/TDAP  Never done  .  PAP SMEAR-Modifier  02/10/2020  . INFLUENZA VACCINE  07/19/2020  . HIV Screening  Completed    Discussed health benefits of physical activity, and encouraged her to engage in regular exercise appropriate for her age and condition.  1. Annual physical exam Normal physical exam today. Will check labs as below and f/u pending lab results. If labs are stable and WNL she will not need to have these rechecked for one year at her next annual physical exam. She is to call the office in the meantime if she has any acute issue, questions or concerns. - Comprehensive metabolic panel - Lipid panel - CBC w/Diff/Platelet - TSH + free  T4 - HgB A1c - Vitamin D (25 hydroxy)  2. Breast mass, left Noted in left breast by patient. Hard to feel by provider but there was an area of denisty that was mobile. Suspect cyst most likely. Patient did have mammogram last year for another mass in the left breast that was noted around 6 o'clock. There is family history of breast cancer. - MM DIAG BREAST TOMO UNI LEFT; Future - US BREAST LTD UNI LEFT INC AXILLA; Future  3. Family history of breast cancer See above medical treatment plan. - MM DIAG BREAST TOMO UNI LEFT; Future - US BREAST LTD UNI LEFT INC AXILLA; Future  4. Encounter for weight loss counseling Will restart phentermine. Patient only uses 1/2 tab daily.  - phentermine (ADIPEX-P) 37.5 MG tablet; Take 1 tablet (37.5 mg total) by mouth daily before breakfast.  Dispense: 30 tablet; Refill: 2  5. Personal history of covid-19 In march 2021. Patient wants to see if she still has antibodies before getting the vaccine. Discussed that this test only shows for that moment in time and does not mean she is completely immune. Advised vaccinations still recommended 90 days after infection. Labs ordered at patient request.  - SAR CoV2 Serology (COVID 19)AB(IGG)IA   No follow-ups on file.     Delmer Islam, PA-C, have reviewed all documentation for this visit. The documentation on 05/26/20 for the exam, diagnosis, procedures, and orders are all accurate and complete.   Reine Just  [facility] [phone] (phone) [phone] (fax)  [facility]

## 2020-05-26 DIAGNOSIS — Z8616 Personal history of COVID-19: Secondary | ICD-10-CM | POA: Insufficient documentation

## 2020-06-01 ENCOUNTER — Encounter: Payer: Self-pay | Admitting: Physician Assistant

## 2020-06-01 DIAGNOSIS — K648 Other hemorrhoids: Secondary | ICD-10-CM

## 2020-06-02 ENCOUNTER — Other Ambulatory Visit: Payer: 59

## 2020-06-03 ENCOUNTER — Ambulatory Visit
Admission: RE | Admit: 2020-06-03 | Discharge: 2020-06-03 | Disposition: A | Discharge status: Home / Self Care | Payer: 59 | Source: Ambulatory Visit | Attending: Physician Assistant | Admitting: Physician Assistant

## 2020-06-03 ENCOUNTER — Encounter: Payer: Self-pay | Admitting: Physician Assistant

## 2020-06-03 DIAGNOSIS — N6320 Unspecified lump in the left breast, unspecified quadrant: Secondary | ICD-10-CM

## 2020-06-03 DIAGNOSIS — Z803 Family history of malignant neoplasm of breast: Secondary | ICD-10-CM | POA: Insufficient documentation

## 2020-06-03 DIAGNOSIS — N6489 Other specified disorders of breast: Secondary | ICD-10-CM | POA: Diagnosis not present

## 2020-06-03 DIAGNOSIS — R928 Other abnormal and inconclusive findings on diagnostic imaging of breast: Secondary | ICD-10-CM | POA: Diagnosis not present

## 2020-06-04 ENCOUNTER — Ambulatory Visit: Payer: Self-pay | Admitting: Surgery

## 2020-06-05 NOTE — Addendum Note (Signed)
Addended by: Margaretann Loveless on: 06/05/2020 02:37 PM   Modules accepted: Orders

## 2020-06-11 ENCOUNTER — Ambulatory Visit (INDEPENDENT_AMBULATORY_CARE_PROVIDER_SITE_OTHER): Payer: 59 | Admitting: Surgery

## 2020-06-11 ENCOUNTER — Encounter: Payer: Self-pay | Admitting: Surgery

## 2020-06-11 ENCOUNTER — Other Ambulatory Visit: Payer: Self-pay

## 2020-06-11 VITALS — BP 117/84 | HR 109 | Temp 90.9°F | Resp 12 | Ht 59.0 in | Wt 150.2 lb

## 2020-06-11 DIAGNOSIS — K644 Residual hemorrhoidal skin tags: Secondary | ICD-10-CM | POA: Diagnosis not present

## 2020-06-11 DIAGNOSIS — K640 First degree hemorrhoids: Secondary | ICD-10-CM

## 2020-06-11 DIAGNOSIS — K5900 Constipation, unspecified: Secondary | ICD-10-CM | POA: Diagnosis not present

## 2020-06-11 NOTE — Progress Notes (Signed)
Patient ID: Wendy Hunt, female   DOB: 03-31-1986, 34 y.o.   MRN: [id number]  Chief Complaint: Hemorrhoidal skin tags  History of Present Illness Wendy Hunt is a 34 y.o. female with a lifelong history of having bowel movements twice weekly as a routine.  She reports when she has diarrhea her skin tags become more inflamed.  She is uncertain when all this began, but noted this more when taking iron for anemia that she had after giving birth.  She does have occasional episodes of bright red blood with wiping.  She reports some tenderness and occasional itching.  She is utilize some topical medications.  She is previously taken Metamucil in the past but not recently. Her concerns seem to be primarily cosmetic in nature.  Past Medical History Past Medical History:  Diagnosis Date  . Anemia   . Palpitations   . RAD (reactive airway disease) with wheezing   . Tachycardia       Past Surgical History:  Procedure Laterality Date  . CESAREAN SECTION  November 2013  . HYSTEROSCOPY WITH D & C  12/09/2019   Procedure: DILATATION AND CURETTAGE /HYSTEROSCOPY;  Surgeon: Schermerhorn, Ihor Austin, MD;  Location: [facility] ORS;  Service: Gynecology;;  . WISDOM TOOTH EXTRACTION  2003    Allergies  Allergen Reactions  . Lidocaine-Epinephrine Rash    Skin irritation   . Sulfa Antibiotics Rash    Current Outpatient Medications  Medication Sig Dispense Refill  . albuterol (VENTOLIN HFA) 108 (90 Base) MCG/ACT inhaler Inhale 1-2 puffs into the lungs every 6 (six) hours as needed for wheezing or shortness of breath. 18 g 1  . ferrous sulfate 325 (65 FE) MG tablet Take 325 mg by mouth daily with breakfast.    . phentermine (ADIPEX-P) 37.5 MG tablet Take 1 tablet (37.5 mg total) by mouth daily before breakfast. 30 tablet 2  . vitamin C (ASCORBIC ACID) 500 MG tablet Take 500 mg by mouth daily.     No current facility-administered medications for this visit.    Family History Family History  Problem  Relation Age of Onset  . Healthy Mother   . Hyperlipidemia Mother   . Healthy Father        Pre-DM and hypochondriac  . Diabetes Maternal Grandfather   . Diabetes Paternal Grandmother   . Breast cancer Paternal Grandmother   . Esophageal cancer Paternal Grandfather   . Diabetes Other   . Glaucoma Other   . Breast cancer Other   . Angina Maternal Grandmother       Social History Social History   Tobacco Use  . Smoking status: Former Smoker    Packs/day: 0.50    Years: 8.00    Pack years: 4.00    Quit date: 12/19/2011    Years since quitting: 8.4  . Smokeless tobacco: Never Used  Vaping Use  . Vaping Use: Never used  Substance Use Topics  . Alcohol use: Yes    Comment: SOCIAL  . Drug use: No        Review of Systems  Constitutional: Negative.   HENT: Negative.   Eyes: Negative.   Respiratory: Positive for wheezing.   Cardiovascular: Positive for palpitations.  Gastrointestinal: Negative.   Genitourinary: Negative.   Skin: Negative.   Neurological: Negative.   Endo/Heme/Allergies: Negative.   Psychiatric/Behavioral: Negative.       Physical Exam Blood pressure 117/84, pulse (!) 109, temperature (!) 90.9 F (32.7 C), resp. rate 12, height 4\' 11"  (1.499 m),  weight 150 lb 3.2 oz (68.1 kg), last menstrual period 05/16/2020, SpO2 97 %. Last Weight  Most recent update: 06/11/2020  3:38 PM   Weight  68.1 kg (150 lb 3.2 oz)            CONSTITUTIONAL: Well developed, and nourished, appropriately responsive and aware without distress.   EYES: Sclera non-icteric.   EARS, NOSE, MOUTH AND THROAT: Mask worn.    Hearing is intact to voice.  NECK: Trachea is midline, and there is no jugular venous distension.  LYMPH NODES:  Lymph nodes in the neck are not enlarged. RESPIRATORY:  Lungs are clear, and breath sounds are equal bilaterally. Normal respiratory effort without pathologic use of accessory muscles. CARDIOVASCULAR: Heart is regular in rate and rhythm. GI: The  abdomen is soft, nontender, and nondistended. There were no palpable masses. I did not appreciate hepatosplenomegaly. There were normal bowel sounds. GU: Soft noninflamed external hemorrhoidal skin tags were present circumferentially.  There is circumferential grade 1 hemorrhoidal tissue, without redundancy or prolapse.  There is an anterior eventration of the rectal vault consistent with a rectocele.  There was hard stool in the proximal rectum.  There is no bright red blood, no remarkable tenderness.  Surrounding perianal skin was free of inflammation, induration or fluctuance.  Good sphincter tone, without spasm. MUSCULOSKELETAL:  Symmetrical muscle tone appreciated in all four extremities.    SKIN: Skin turgor is normal. No pathologic skin lesions appreciated.  NEUROLOGIC:  Motor and sensation appear grossly normal.  Cranial nerves are grossly without defect. PSYCH:  Alert and oriented to person, place and time. Affect is appropriate for situation.  Data Reviewed I have personally reviewed what is currently available of the patient's imaging, recent labs and medical records.   Labs:  CBC Latest Ref Rng & Units 12/05/2019 03/23/2018 12/31/2015  WBC 4.0 - 10.5 K/uL 8.1 7.4 8.8  Hemoglobin 12.0 - 15.0 g/dL 13.0 86.5 78.4  Hematocrit 36 - 46 % 42.8 41.9 40.7  Platelets 150 - 400 K/uL 278 277 287   CMP Latest Ref Rng & Units 12/05/2019 03/23/2018 12/31/2015  Glucose 70 - 99 mg/dL 93 84 696(E)  BUN 6 - 20 mg/dL 11 14 9   Creatinine 0.44 - 1.00 mg/dL 9.52 8.41  Sodium 135 - 145 mmol/L 141 141 140  Potassium 3.5 - 5.1 mmol/L 4.0 4.0 3.6  Chloride 98 - 111 mmol/L 109 102 99  CO2 22 - 32 mmol/L 23 22 26   Calcium 8.9 - 10.3 mg/dL 9.1 9.3 9.1  Total Protein 6.0 - 8.5 g/dL - 7.4 7.1  Total Bilirubin 0.0 - 1.2 mg/dL - 0.5 0.3  Alkaline Phos 39 - 117 IU/L - 47 48  AST 0 - 40 IU/L - 17 25  ALT 0 - 32 IU/L - 16 30      Imaging:  Within last 24 hrs: No results found.  Assessment    Multiple  uninflamed external hemorrhoidal skin tags. Grade 1 hemorrhoids. Constipation, chronic. Patient Active Problem List   Diagnosis Date Noted  . Personal history of covid-19 05/26/2020  . Anemia, iron deficiency 07/17/2015  . Excess, menstruation 07/17/2015  . RAD (reactive airway disease) 07/17/2015  . Airway hyperreactivity 07/17/2015    Plan    Resumption of fiber, fluids, with backup MiraLAX to encourage at minimum daily bowel movements and ideally 2-3 daily.  Intention is to prevent progression of her hemorrhoidal disease and hopefully defer/diminish her risk of diverticulosis. We discussed limited excision of external tags under  local anesthesia.  I do not feel its beneficial to consider general anesthetic for this type of excision. Additional discussion noted.  Face-to-face time spent with the patient and accompanying care providers(if present) was 30 minutes, with more than 50% of the time spent counseling, educating, and coordinating care of the patient.      Ronny Bacon M.D., FACS 06/11/2020, 4:15 PM

## 2020-06-11 NOTE — Patient Instructions (Addendum)
Start eating foods with high fiber. You may take supplements such as fiber gummies, metamucil, benefiber, etc. Aim for 25-30g of fiber. Continue to drink plenty of water.   Start taking over the counter Miralax to help with your bowel movements.    Follow up as needed, call the office if you have any questions or concerns.  High-Fiber Diet Fiber, also called dietary fiber, is a type of carbohydrate that is found in fruits, vegetables, whole grains, and beans. A high-fiber diet can have many health benefits. Your health care provider may recommend a high-fiber diet to help:  Prevent constipation. Fiber can make your bowel movements more regular.  Lower your cholesterol.  Relieve the following conditions: ? Swelling of veins in the anus (hemorrhoids). ? Swelling and irritation (inflammation) of specific areas of the digestive tract (uncomplicated diverticulosis). ? A problem of the large intestine (colon) that sometimes causes pain and diarrhea (irritable bowel syndrome, IBS).  Prevent overeating as part of a weight-loss plan.  Prevent heart disease, type 2 diabetes, and certain cancers. What is my plan? The recommended daily fiber intake in grams (g) includes:  38 g for men age 88 or younger.  30 g for men over age 66.  25 g for women age 74 or younger.  21 g for women over age 47. You can get the recommended daily intake of dietary fiber by:  Eating a variety of fruits, vegetables, grains, and beans.  Taking a fiber supplement, if it is not possible to get enough fiber through your diet. What do I need to know about a high-fiber diet?  It is better to get fiber through food sources rather than from fiber supplements. There is not a lot of research about how effective supplements are.  Always check the fiber content on the nutrition facts label of any prepackaged food. Look for foods that contain 5 g of fiber or more per serving.  Talk with a diet and nutrition specialist  (dietitian) if you have questions about specific foods that are recommended or not recommended for your medical condition, especially if those foods are not listed below.  Gradually increase how much fiber you consume. If you increase your intake of dietary fiber too quickly, you may have bloating, cramping, or gas.  Drink plenty of water. Water helps you to digest fiber. What are tips for following this plan?  Eat a wide variety of high-fiber foods.  Make sure that half of the grains that you eat each day are whole grains.  Eat breads and cereals that are made with whole-grain flour instead of refined flour or white flour.  Eat brown rice, bulgur wheat, or millet instead of white rice.  Start the day with a breakfast that is high in fiber, such as a cereal that contains 5 g of fiber or more per serving.  Use beans in place of meat in soups, salads, and pasta dishes.  Eat high-fiber snacks, such as berries, raw vegetables, nuts, and popcorn.  Choose whole fruits and vegetables instead of processed forms like juice or sauce. What foods can I eat?  Fruits Berries. Pears. Apples. Oranges. Avocado. Prunes and raisins. Dried figs. Vegetables Sweet potatoes. Spinach. Kale. Artichokes. Cabbage. Broccoli. Cauliflower. Green peas. Carrots. Squash. Grains Whole-grain breads. Multigrain cereal. Oats and oatmeal. Brown rice. Barley. Bulgur wheat. Millet. Quinoa. Bran muffins. Popcorn. Rye wafer crackers. Meats and other proteins Navy, kidney, and pinto beans. Soybeans. Split peas. Lentils. Nuts and seeds. Dairy Fiber-fortified yogurt. Beverages Fiber-fortified soy  milk. Fiber-fortified orange juice. Other foods Fiber bars. The items listed above may not be a complete list of recommended foods and beverages. Contact a dietitian for more options. What foods are not recommended? Fruits Fruit juice. Cooked, strained fruit. Vegetables Fried potatoes. Canned vegetables. Well-cooked  vegetables. Grains White bread. Pasta made with refined flour. White rice. Meats and other proteins Fatty cuts of meat. Fried chicken or fried fish. Dairy Milk. Yogurt. Cream cheese. Sour cream. Fats and oils Butters. Beverages Soft drinks. Other foods Cakes and pastries. The items listed above may not be a complete list of foods and beverages to avoid. Contact a dietitian for more information. Summary  Fiber is a type of carbohydrate. It is found in fruits, vegetables, whole grains, and beans.  There are many health benefits of eating a high-fiber diet, such as preventing constipation, lowering blood cholesterol, helping with weight loss, and reducing your risk of heart disease, diabetes, and certain cancers.  Gradually increase your intake of fiber. Increasing too fast can result in cramping, bloating, and gas. Drink plenty of water while you increase your fiber.  The best sources of fiber include whole fruits and vegetables, whole grains, nuts, seeds, and beans. This information is not intended to replace advice given to you by your health care provider. Make sure you discuss any questions you have with your health care provider. Document Revised: 10/09/2017 Document Reviewed: 10/09/2017 Elsevier Patient Education  2020 [employer].

## 2020-06-16 ENCOUNTER — Telehealth: Payer: Self-pay

## 2020-06-16 NOTE — Telephone Encounter (Signed)
Copied from CRM [id number]. Topic: General - Call Back - No Documentation >> Jun 16, 2020 12:54 PM Reuben Likes D wrote: Reason for CRM: Patient states she needed to speak with Maralyn Sago, regarding a denial for an ultrasound.  Reason Given:They did not see physical exam results, patient states that Maralyn Sago can leave a detailed message because she will not be able to answer her phone.

## 2020-06-29 ENCOUNTER — Encounter: Payer: Self-pay | Admitting: Surgery

## 2020-06-30 ENCOUNTER — Telehealth: Payer: Self-pay

## 2020-06-30 NOTE — Telephone Encounter (Signed)
Instructed patient to monitor and continue with the fiber. Call office if worsening symptoms occur.

## 2020-07-22 DIAGNOSIS — Z0000 Encounter for general adult medical examination without abnormal findings: Secondary | ICD-10-CM | POA: Diagnosis not present

## 2020-07-22 DIAGNOSIS — Z8616 Personal history of COVID-19: Secondary | ICD-10-CM | POA: Diagnosis not present

## 2020-07-23 LAB — CBC WITH DIFFERENTIAL/PLATELET
Basophils Absolute: 0.1 10*3/uL (ref 0.0–0.2)
Basos: 1 %
EOS (ABSOLUTE): 0.6 10*3/uL — ABNORMAL HIGH (ref 0.0–0.4)
Eos: 6 %
Hematocrit: 40.7 % (ref 34.0–46.6)
Hemoglobin: 13.6 g/dL (ref 11.1–15.9)
Immature Grans (Abs): 0.1 10*3/uL (ref 0.0–0.1)
Immature Granulocytes: 1 %
Lymphocytes Absolute: 2.6 10*3/uL (ref 0.7–3.1)
Lymphs: 30 %
MCH: 28.1 pg (ref 26.6–33.0)
MCHC: 33.4 g/dL (ref 31.5–35.7)
MCV: 84 fL (ref 79–97)
Monocytes Absolute: 0.5 10*3/uL (ref 0.1–0.9)
Monocytes: 6 %
Neutrophils Absolute: 4.8 10*3/uL (ref 1.4–7.0)
Neutrophils: 56 %
Platelets: 301 10*3/uL (ref 150–450)
RBC: 4.84 x10E6/uL (ref 3.77–5.28)
RDW: 12.3 % (ref 11.7–15.4)
WBC: 8.6 10*3/uL (ref 3.4–10.8)

## 2020-07-23 LAB — COMPREHENSIVE METABOLIC PANEL
ALT: 14 IU/L (ref 0–32)
AST: 15 IU/L (ref 0–40)
Albumin/Globulin Ratio: 1.7 (ref 1.2–2.2)
Albumin: 4.5 g/dL (ref 3.8–4.8)
Alkaline Phosphatase: 56 IU/L (ref 48–121)
BUN/Creatinine Ratio: 15 (ref 9–23)
BUN: 14 mg/dL (ref 6–20)
Bilirubin Total: <0.2 mg/dL (ref 0.0–1.2)
CO2: 21 mmol/L (ref 20–29)
Calcium: 9 mg/dL (ref 8.7–10.2)
Chloride: 103 mmol/L (ref 96–106)
Creatinine, Ser: 0.93 mg/dL (ref 0.57–1.00)
GFR calc Af Amer: 92 mL/min/{1.73_m2} (ref >59)
GFR calc non Af Amer: 80 mL/min/{1.73_m2} (ref >59)
Globulin, Total: 2.6 g/dL (ref 1.5–4.5)
Glucose: 109 mg/dL — ABNORMAL HIGH (ref 65–99)
Potassium: 4.2 mmol/L (ref 3.5–5.2)
Sodium: 139 mmol/L (ref 134–144)
Total Protein: 7.1 g/dL (ref 6.0–8.5)

## 2020-07-23 LAB — TSH+FREE T4
Free T4: 1.41 ng/dL (ref 0.82–1.77)
TSH: 1.37 u[IU]/mL (ref 0.450–4.500)

## 2020-07-23 LAB — LIPID PANEL
Chol/HDL Ratio: 3.2 ratio (ref 0.0–4.4)
Cholesterol, Total: 149 mg/dL (ref 100–199)
HDL: 46 mg/dL (ref >39)
LDL Chol Calc (NIH): 70 mg/dL (ref 0–99)
Triglycerides: 202 mg/dL — ABNORMAL HIGH (ref 0–149)
VLDL Cholesterol Cal: 33 mg/dL (ref 5–40)

## 2020-07-23 LAB — SAR COV2 SEROLOGY (COVID19)AB(IGG),IA: DiaSorin SARS-CoV-2 Ab, IgG: POSITIVE

## 2020-07-23 LAB — HEMOGLOBIN A1C
Est. average glucose Bld gHb Est-mCnc: 105 mg/dL
Hgb A1c MFr Bld: 5.3 % (ref 4.8–5.6)

## 2020-07-23 LAB — VITAMIN D 25 HYDROXY (VIT D DEFICIENCY, FRACTURES): Vit D, 25-Hydroxy: 36.9 ng/mL (ref 30.0–100.0)

## 2020-09-30 ENCOUNTER — Other Ambulatory Visit: Payer: Self-pay

## 2020-09-30 ENCOUNTER — Ambulatory Visit (INDEPENDENT_AMBULATORY_CARE_PROVIDER_SITE_OTHER): Payer: 59 | Admitting: Physician Assistant

## 2020-09-30 ENCOUNTER — Encounter: Payer: Self-pay | Admitting: Physician Assistant

## 2020-09-30 VITALS — BP 114/90 | HR 103 | Temp 98.5°F | Resp 16 | Wt 143.8 lb

## 2020-09-30 DIAGNOSIS — Z23 Encounter for immunization: Secondary | ICD-10-CM | POA: Diagnosis not present

## 2020-09-30 DIAGNOSIS — B351 Tinea unguium: Secondary | ICD-10-CM | POA: Diagnosis not present

## 2020-09-30 DIAGNOSIS — B356 Tinea cruris: Secondary | ICD-10-CM | POA: Diagnosis not present

## 2020-09-30 MED ORDER — KETOCONAZOLE 200 MG PO TABS: 200.0000 mg | ORAL_TABLET | Freq: Every day | ORAL | 0 refills | Status: DC

## 2020-09-30 NOTE — Patient Instructions (Signed)
Ketoconazole tablets What is this medicine? KETOCONAZOLE (kee toe KON na zole) is an antifungal medicine. It is used to treat certain kinds of fungal infections. This medicine may be used for other purposes; ask your health care provider or pharmacist if you have questions. COMMON BRAND NAME(S): Nizoral What should I tell my health care provider before I take this medicine? They need to know if you have any of these conditions:  adrenal problems  an alcohol abuse problem  history of irregular heartbeat  low stomach acid production  liver disease  an unusual or allergic reaction to ketoconazole, itraconazole, miconazole, other medicines, foods, dyes or preservatives  pregnant or trying to get pregnant  breast-feeding How should I use this medicine? Take this medicine by mouth with a full glass of water. Follow the directions on the prescription label. This medicine works best if you take it with food. Take your medicine at regular intervals. Do not take your medicine more often than directed. Do not stop taking except on your doctor's advice. A special MedGuide will be given to you by the pharmacist with each prescription and refill. Be sure to read this information carefully each time. Talk to your pediatrician regarding the use of this medicine in children. Special care may be needed. Overdosage: If you think you have taken too much of this medicine contact a poison control center or emergency room at once. NOTE: This medicine is only for you. Do not share this medicine with others. What if I miss a dose? If you miss a dose, take it as soon as you can. If it is almost time for your next dose, take only that dose. Do not take double or extra doses. What may interact with this medicine? Do not take this medicine with any of the following medications:  alfuzosin  certain medicines for anxiety or sleep like alprazolam, midazolam, triazolam  certain medicines for blood pressure like  felodipine, nisoldipine, eplerenone  certain medicines for cancer like irinotecan, ibrutinib  certain medicines for cholesterol like cerivastatin, lovastatin, simvastatin, lomitapide  certain medicines for irregular heart rate like disopyramide, dofetilide, dronedarone, quinidine  cisapride  colchicine  conivaptan  ergot alkaloids like dihydroergotamine, ergonovine, ergotamine, methylergonovine  lurasidone  methadone  naloxegol  nevirapine  other medicines that prolong the QT interval (cause an abnormal heart rhythm)  pimozide  ranolazine  red yeast rice  sirolimus  thioridazine  tolvaptan This medicine may also interact with the following medications:  alcohol or any product that contains alcohol  aliskiren  amlodipine  antacids  antiviral medicines for HIV or AIDS  aprepitant  atorvastatin  bosentan  buprenorphine  certain medicine for bladder problems like fesoterodine, solifenacin, tolterodine  certain medicines for cancer like bortezomib, busulfan, dasatinib, docetaxel, erlotinib, imatinib, ixabepilone, lapatinib, nilotinib, paclitaxel, trimetrexate, vinca alkaloids  certain medicines for depression, anxiety, or psychotic disturbances like aripiprazole, buspirone, haloperidol, quetiapine, risperidone  certain medicines for erectile dysfunction like vardenafil, sildenafil, tadalafil  certain medicines for pain like alfentanil, fentanyl, oxycodone, sufentanil  certain medicines for stomach problems like cimetidine, famotidine, omeprazole, lansoprazole  certain medicines that treat or prevent blood clots like dabigatran, rivaroxaban, warfarin  certain medicines for seizures like carbamazepine, phenytoin  certain medicines for tuberculosis like isoniazid, INH, rifabutin, rifampin, rifapentine  cilostazol  cinacalcet  cyclosporine  digoxin  eletriptan  isradipine  nadolol  nifedipine  other medicines for fungal  infections  praziquantel  ramelteon  repaglinide  salmeterol  saxagliptin  steroid medicines like budesonide, ciclesonide, dexamethasone, methylprednisolone  tacrolimus  tamsulosin  telithromycin  verapamil  ziprasidone This list may not describe all possible interactions. Give your health care provider a list of all the medicines, herbs, non-prescription drugs, or dietary supplements you use. Also tell them if you smoke, drink alcohol, or use illegal drugs. Some items may interact with your medicine. What should I watch for while using this medicine? Visit your doctor or health care professional for check ups. Tell your doctor if your symptoms do not improve. Some fungal infections can take many weeks or months of treatment to cure. Avoid medicines for your stomach like antacids and acid blockers for at least two hours after taking this medicine. You may get drowsy or dizzy. Do not drive, use machinery, or do anything that needs mental alertness until you know how this medicine affects you. Do not stand or sit up quickly, especially if you are an older patient. This reduces the risk of dizzy or fainting spells. Avoid alcohol while you are taking this medicine. Alcohol can increase the risk of liver damage. If you are going to have surgery, let your doctor know that you have been taking this medicine. This medicine may cause a decrease in vitamin D. You should make sure that you get enough vitamin D while you are taking this medicine. Discuss the foods you eat and the vitamins you take with your health care professional. What side effects may I notice from receiving this medicine? Side effects that you should report to your doctor or health care professional as soon as possible:  allergic reactions like skin rash, itching or hives, swelling of the face, lips, or tongue  dark urine  feeling dizzy, faint  fever  irregular heartbeat, chest pain  light color stools  loss of  appetite  usually tired or weak  yellowing of the eyes or skin Side effects that usually do not require medical attention (report to your doctor or health care professional if they continue or are bothersome):  breast swelling and tenderness  change in sex drive or performance  eyes more sensitive to light  headache  nausea, vomiting  stomach pain This list may not describe all possible side effects. Call your doctor for medical advice about side effects. You may report side effects to FDA at 1-800-FDA-1088. Where should I keep my medicine? Keep out of the reach of children. Store at room temperature between 15 and 25 degrees C (59 and 77 degrees F). Keep container tightly closed. Throw away any unused medicine after the expiration date. NOTE: This sheet is a summary. It may not cover all possible information. If you have questions about this medicine, talk to your doctor, pharmacist, or health care provider.  2020 Elsevier/Gold Standard (2018-11-26 13:48:56)

## 2020-09-30 NOTE — Progress Notes (Signed)
Established patient visit   Patient: Wendy Hunt   DOB: 1986-09-19   34 y.o. Female  MRN: [id number] Visit Date: 09/30/2020  Today's healthcare provider: Margaretann Loveless, PA-C   Chief Complaint  Patient presents with  . Rash   Subjective    Rash The current episode started more than 1 month ago (brown spots since July). The problem is unchanged. The affected locations include the groin. She was exposed to nothing. Past treatments include nothing.    Toe nail fungus on her big toe.  Patient Active Problem List   Diagnosis Date Noted  . Constipation 06/11/2020  . Grade I internal hemorrhoids 06/11/2020  . Personal history of COVID-19 05/26/2020  . Anemia, iron deficiency 07/17/2015  . Excess, menstruation 07/17/2015  . RAD (reactive airway disease) 07/17/2015  . Hemorrhoids, external without complications 07/17/2015  . Airway hyperreactivity 07/17/2015       Medications: Outpatient Medications Prior to Visit  Medication Sig  . albuterol (VENTOLIN HFA) 108 (90 Base) MCG/ACT inhaler Inhale 1-2 puffs into the lungs every 6 (six) hours as needed for wheezing or shortness of breath.  . ferrous sulfate 325 (65 FE) MG tablet Take 325 mg by mouth daily with breakfast.  . fluticasone (FLONASE) 50 MCG/ACT nasal spray Place into both nostrils daily.  . phentermine (ADIPEX-P) 37.5 MG tablet Take 1 tablet (37.5 mg total) by mouth daily before breakfast. (Patient taking differently: Take 37.5 mg by mouth daily before breakfast. )  . vitamin C (ASCORBIC ACID) 500 MG tablet Take 500 mg by mouth daily.   No facility-administered medications prior to visit.    Review of Systems  Constitutional: Negative.   Respiratory: Negative.   Cardiovascular: Negative.   Skin: Positive for rash.    Last CBC Lab Results  Component Value Date   WBC 8.6 07/22/2020   HGB 13.6 07/22/2020   HCT 40.7 07/22/2020   MCV 84 07/22/2020   MCH 28.1 07/22/2020   RDW 12.3 07/22/2020   PLT  301 07/22/2020   Last metabolic panel Lab Results  Component Value Date   GLUCOSE 109 (H) 07/22/2020   NA 139 07/22/2020   K 4.2 07/22/2020   CL 103 07/22/2020   CO2 21 07/22/2020   BUN 14 07/22/2020   CREATININE 0.93 07/22/2020   GFRNONAA 80 07/22/2020   GFRAA 92 07/22/2020   CALCIUM 9.0 07/22/2020   PROT 7.1 07/22/2020   ALBUMIN 4.5 07/22/2020   LABGLOB 2.6 07/22/2020   AGRATIO 1.7 07/22/2020   BILITOT <0.2 07/22/2020   ALKPHOS 56 07/22/2020   AST 15 07/22/2020   ALT 14 07/22/2020   ANIONGAP 9 12/05/2019      Objective    BP 114/90 (BP Location: Left Arm, Patient Position: Sitting, Cuff Size: Large)   Pulse (!) 103   Temp 98.5 F (36.9 C) (Oral)   Resp 16   Wt 143 lb 12.8 oz (65.2 kg)   BMI 29.04 kg/m  BP Readings from Last 3 Encounters:  09/30/20 114/90  06/11/20 117/84  05/22/20 126/88   Wt Readings from Last 3 Encounters:  09/30/20 143 lb 12.8 oz (65.2 kg)  06/11/20 150 lb 3.2 oz (68.1 kg)  05/22/20 148 lb 6.4 oz (67.3 kg)      Physical Exam Vitals reviewed.  Constitutional:      Appearance: She is well-developed.  HENT:     Head: Normocephalic and atraumatic.  Pulmonary:     Effort: Pulmonary effort is normal. No respiratory distress.  Musculoskeletal:     Cervical back: Normal range of motion and neck supple.  Skin:    General: Skin is warm and dry.     Findings: Rash (red, well demarcated rash in groin bilaterally and upper thigh) present.  Psychiatric:        Mood and Affect: Mood normal.        Behavior: Behavior normal.        Thought Content: Thought content normal.        Judgment: Judgment normal.       No results found for any visits on 09/30/20.  Assessment & Plan     1. Need for influenza vaccination Flu vaccine given today without complication. Patient sat upright for 15 minutes to check for adverse reaction before being released. - Flu Vaccine QUAD 36+ mos IM  2. Tinea cruris Worsening rash in groin. Been present since  this summer. Will give ketoconazole as below for rash and toe nail fungus. Call if worsening.  - ketoconazole (NIZORAL) 200 MG tablet; Take 1 tablet (200 mg total) by mouth daily.  Dispense: 20 tablet; Refill: 0  3. Onychomycosis See above medical treatment plan.   No follow-ups on file.      Delmer Islam, PA-C, have reviewed all documentation for this visit. The documentation on 09/30/20 for the exam, diagnosis, procedures, and orders are all accurate and complete.   Reine Just  [facility] [phone] (phone) [phone] (fax)  [facility]

## 2020-10-22 ENCOUNTER — Encounter: Payer: Self-pay | Admitting: Physician Assistant

## 2020-10-22 DIAGNOSIS — B379 Candidiasis, unspecified: Secondary | ICD-10-CM

## 2020-10-23 MED ORDER — FLUCONAZOLE 150 MG PO TABS: 150.0000 mg | ORAL_TABLET | Freq: Once | ORAL | 0 refills | Status: DC

## 2020-10-28 MED ORDER — FLUCONAZOLE 150 MG PO TABS: 150.0000 mg | ORAL_TABLET | Freq: Once | ORAL | 0 refills | Status: AC

## 2020-10-28 NOTE — Addendum Note (Signed)
Addended by: Margaretann Loveless on: 10/28/2020 12:12 PM   Modules accepted: Orders

## 2021-03-22 ENCOUNTER — Ambulatory Visit (INDEPENDENT_AMBULATORY_CARE_PROVIDER_SITE_OTHER): Payer: 59 | Admitting: Physician Assistant

## 2021-03-22 ENCOUNTER — Other Ambulatory Visit: Payer: Self-pay

## 2021-03-22 ENCOUNTER — Encounter: Payer: Self-pay | Admitting: Physician Assistant

## 2021-03-22 VITALS — BP 125/88 | HR 90 | Temp 98.7°F | Resp 16 | Wt 147.0 lb

## 2021-03-22 DIAGNOSIS — R4781 Slurred speech: Secondary | ICD-10-CM | POA: Diagnosis not present

## 2021-03-22 DIAGNOSIS — N921 Excessive and frequent menstruation with irregular cycle: Secondary | ICD-10-CM | POA: Diagnosis not present

## 2021-03-22 DIAGNOSIS — M533 Sacrococcygeal disorders, not elsewhere classified: Secondary | ICD-10-CM | POA: Diagnosis not present

## 2021-03-22 DIAGNOSIS — Z789 Other specified health status: Secondary | ICD-10-CM

## 2021-03-22 DIAGNOSIS — R030 Elevated blood-pressure reading, without diagnosis of hypertension: Secondary | ICD-10-CM | POA: Diagnosis not present

## 2021-03-22 DIAGNOSIS — G8929 Other chronic pain: Secondary | ICD-10-CM | POA: Diagnosis not present

## 2021-03-22 MED ORDER — TRIAMTERENE-HCTZ 37.5-25 MG PO TABS: 1.0000 | ORAL_TABLET | Freq: Every day | ORAL | 3 refills | Status: DC

## 2021-03-22 NOTE — Progress Notes (Addendum)
I,Wendy Hunt,acting as a scribe for [employer], PA-C.,have documented all relevant documentation on the behalf of Wendy Loveless, PA-C,as directed by  Wendy Loveless, PA-C while in the presence of Wendy Loveless, PA-C.   Established patient visit   Patient: Wendy Hunt   DOB: 29-Nov-1986   35 y.o. Female  MRN: [id number] Visit Date: 03/22/2021  Today's healthcare provider: Margaretann Loveless, PA-C   Chief Complaint  Patient presents with  . Breast Pain    Left  . Back Pain   Subjective    HPI HPI    Breast Pain     Additional comments: Left       Last edited by Wendy Hunt, CMA on 03/22/2021  3:47 PM. (History)      Patient is here concerning left breast pain. Improved now. Has had mammograms and [place]. Reports pain is intermittent and always in the same area. Not associated with menstrual cycles.   Also having left lower back times 2 weeks. Pain is recurrent and intermittent. Not treating pain at home. Feels sharp, stabbing pain over the left SI joint. Always occurs with going from sitting to standing position and first step. Is intermittent but becoming more frequent.   Also attempting to conceive with her current husband, has an 13 yr old son. No pregnancies since. Has had a D&C. She does have very heavy menstrual cycles, passes large clots. Followed by Dr. Feliberto Gottron, OB/GYN. Patient Active Problem List   Diagnosis Date Noted  . Constipation 06/11/2020  . Grade I internal hemorrhoids 06/11/2020  . Personal history of COVID-19 05/26/2020  . Anemia, iron deficiency 07/17/2015  . Excess, menstruation 07/17/2015  . RAD (reactive airway disease) 07/17/2015  . Hemorrhoids, external without complications 07/17/2015  . Airway hyperreactivity 07/17/2015   Past Medical History:  Diagnosis Date  . Anemia   . Palpitations   . RAD (reactive airway disease) with wheezing   . Tachycardia        Medications: Outpatient Medications Prior to  Visit  Medication Sig  . albuterol (VENTOLIN HFA) 108 (90 Base) MCG/ACT inhaler Inhale 1-2 puffs into the lungs every 6 (six) hours as needed for wheezing or shortness of breath.  Lucila Maine 575 MG/5ML SYRP   . ferrous sulfate 325 (65 FE) MG tablet Take 325 mg by mouth daily with breakfast.  . fluticasone (FLONASE) 50 MCG/ACT nasal spray Place into both nostrils daily.  Marland Kitchen FOLIC ACID PO Take by mouth.  . Multiple Vitamins-Minerals (MULTI ADULT GUMMIES) CHEW   . phentermine (ADIPEX-P) 37.5 MG tablet Take 1 tablet (37.5 mg total) by mouth daily before breakfast. (Patient taking differently: Take 37.5 mg by mouth daily before breakfast.)  . vitamin C (ASCORBIC ACID) 500 MG tablet Take 500 mg by mouth daily.   No facility-administered medications prior to visit.    Review of Systems  Constitutional: Negative for appetite change, chills, fatigue and fever.  Respiratory: Negative for chest tightness and shortness of breath.   Cardiovascular: Negative for chest pain and palpitations.  Gastrointestinal: Negative for abdominal pain, nausea and vomiting.  Genitourinary: Positive for menstrual problem.  Musculoskeletal: Positive for back pain.  Neurological: Negative for dizziness and weakness.    Last CBC Lab Results  Component Value Date   WBC 8.6 07/22/2020   HGB 13.6 07/22/2020   HCT 40.7 07/22/2020   MCV 84 07/22/2020   MCH 28.1 07/22/2020   RDW 12.3 07/22/2020   PLT 301 07/22/2020  Last metabolic panel Lab Results  Component Value Date   GLUCOSE 109 (H) 07/22/2020   NA 139 07/22/2020   K 4.2 07/22/2020   CL 103 07/22/2020   CO2 21 07/22/2020   BUN 14 07/22/2020   CREATININE 0.93 07/22/2020   GFRNONAA 80 07/22/2020   GFRAA 92 07/22/2020   CALCIUM 9.0 07/22/2020   PROT 7.1 07/22/2020   ALBUMIN 4.5 07/22/2020   LABGLOB 2.6 07/22/2020   AGRATIO 1.7 07/22/2020   BILITOT <0.2 07/22/2020   ALKPHOS 56 07/22/2020   AST 15 07/22/2020   ALT 14 07/22/2020   ANIONGAP 9  12/05/2019       Objective    BP 125/88 (BP Location: Right Arm, Patient Position: Sitting, Cuff Size: Normal)   Pulse 90   Temp 98.7 F (37.1 C) (Oral)   Resp 16   Wt 147 lb (66.7 kg)   SpO2 98%   BMI 29.69 kg/m  BP Readings from Last 3 Encounters:  03/22/21 125/88  09/30/20 114/90  06/11/20 117/84   Wt Readings from Last 3 Encounters:  03/22/21 147 lb (66.7 kg)  09/30/20 143 lb 12.8 oz (65.2 kg)  06/11/20 150 lb 3.2 oz (68.1 kg)       Physical Exam Vitals reviewed.  Constitutional:      Appearance: Normal appearance. She is well-developed, well-groomed and overweight.  HENT:     Head: Normocephalic and atraumatic.  Pulmonary:     Effort: Pulmonary effort is normal. No respiratory distress.  Musculoskeletal:     Cervical back: Normal range of motion and neck supple.       Back:  Neurological:     Mental Status: She is alert.  Psychiatric:        Behavior: Behavior normal. Behavior is cooperative.        Thought Content: Thought content normal.        Judgment: Judgment normal.       No results found for any visits on 03/22/21.  Assessment & Plan     1. Elevated blood pressure reading Stable. Diagnosis pulled for medication refill. Continue current medical treatment plan. - triamterene-hydrochlorothiazide (MAXZIDE-25) 37.5-25 MG tablet; Take 1 tablet by mouth daily.  Dispense: 90 tablet; Refill: 3  2. Menorrhagia with irregular cycle Will check labs as below and f/u pending results. - Anti mullerian hormone - Fe+TIBC+Fer  3. Chronic left SI joint pain Had intermittent pain over R SI joint for a while. Becoming more frequent. Will get imaging as below. Will f/u pending results.  - DG Lumbar Spine Complete; Future - DG Si Joints; Future  4. Slurred speech New. Only happened once. Was with a patient and was trying to say a word, but was stuttering and nothing would come out. Lasted for about 10-15 sec then improved. None since. Will check labs as below  and f/u pending results. - Vitamin D (25 hydroxy) - B12 - T4 AND TSH - Fe+TIBC+Fer - HgB A1c  5. Attempting to conceive Patient and husband have been trying to conceive for about a year without any success. Will check labs as below and f/u pending results. - Anti mullerian hormone   No follow-ups on file.      Delmer Islam, PA-C, have reviewed all documentation for this visit. The documentation on 03/22/21 for the exam, diagnosis, procedures, and orders are all accurate and complete.   Reine Just  [facility] [phone] (phone) [phone] (fax)  [facility]

## 2021-03-22 NOTE — Patient Instructions (Signed)

## 2021-03-23 ENCOUNTER — Encounter: Payer: Self-pay | Admitting: Physician Assistant

## 2021-03-24 ENCOUNTER — Encounter: Payer: Self-pay | Admitting: Physician Assistant

## 2021-03-24 ENCOUNTER — Ambulatory Visit
Admission: RE | Admit: 2021-03-24 | Discharge: 2021-03-24 | Disposition: A | Discharge status: Home / Self Care | Payer: 59 | Source: Ambulatory Visit | Attending: Physician Assistant | Admitting: Physician Assistant

## 2021-03-24 ENCOUNTER — Ambulatory Visit
Admission: RE | Admit: 2021-03-24 | Discharge: 2021-03-24 | Disposition: A | Discharge status: Home / Self Care | Payer: 59 | Attending: Physician Assistant | Admitting: Physician Assistant

## 2021-03-24 ENCOUNTER — Other Ambulatory Visit: Payer: Self-pay

## 2021-03-24 DIAGNOSIS — G8929 Other chronic pain: Secondary | ICD-10-CM | POA: Diagnosis not present

## 2021-03-24 DIAGNOSIS — M533 Sacrococcygeal disorders, not elsewhere classified: Secondary | ICD-10-CM

## 2021-03-24 DIAGNOSIS — M545 Low back pain, unspecified: Secondary | ICD-10-CM | POA: Diagnosis not present

## 2021-04-12 ENCOUNTER — Encounter: Payer: Self-pay | Admitting: Physician Assistant

## 2021-04-12 ENCOUNTER — Other Ambulatory Visit: Payer: Self-pay

## 2021-04-12 DIAGNOSIS — R0602 Shortness of breath: Secondary | ICD-10-CM

## 2021-04-12 MED ORDER — ALBUTEROL SULFATE HFA 108 (90 BASE) MCG/ACT IN AERS: 1.0000 | INHALATION_SPRAY | Freq: Four times a day (QID) | RESPIRATORY_TRACT | 1 refills | Status: DC | PRN

## 2021-05-25 ENCOUNTER — Ambulatory Visit: Payer: 59 | Admitting: Family Medicine

## 2021-05-28 ENCOUNTER — Ambulatory Visit: Payer: 59 | Admitting: Family Medicine

## 2021-05-28 ENCOUNTER — Other Ambulatory Visit: Payer: Self-pay

## 2021-05-28 ENCOUNTER — Encounter: Payer: Self-pay | Admitting: Family Medicine

## 2021-05-28 VITALS — BP 117/85 | HR 91 | Resp 16

## 2021-05-28 DIAGNOSIS — R4781 Slurred speech: Secondary | ICD-10-CM | POA: Diagnosis not present

## 2021-05-28 DIAGNOSIS — R221 Localized swelling, mass and lump, neck: Secondary | ICD-10-CM | POA: Diagnosis not present

## 2021-05-28 DIAGNOSIS — N921 Excessive and frequent menstruation with irregular cycle: Secondary | ICD-10-CM | POA: Diagnosis not present

## 2021-05-28 NOTE — Progress Notes (Signed)
Established patient visit   Patient: Wendy Hunt   DOB: 1986-07-22   35 y.o. Female  MRN: [id number] Visit Date: 05/28/2021  Today's healthcare provider: Dortha Kern, PA-C   Chief Complaint  Patient presents with   Cyst   Subjective    HPI  Patient C/O knot on left side of neck. Patient reports knot has been present for several years. Patient reports she can now feel knot more. Patient denies any other symptoms.       Past Medical History:  Diagnosis Date   Anemia    Palpitations    RAD (reactive airway disease) with wheezing    Tachycardia    Past Surgical History:  Procedure Laterality Date   CESAREAN SECTION  November 2013   HYSTEROSCOPY WITH D & C  12/09/2019   Procedure: DILATATION AND CURETTAGE /HYSTEROSCOPY;  Surgeon: Schermerhorn, Ihor Austin, MD;  Location: [facility] ORS;  Service: Gynecology;;   WISDOM TOOTH EXTRACTION  2003   Family History  Problem Relation Age of Onset   Healthy Mother    Hyperlipidemia Mother    Healthy Father        Pre-DM and hypochondriac   Diabetes Maternal Grandfather    Diabetes Paternal Grandmother    Breast cancer Paternal Grandmother    Esophageal cancer Paternal Grandfather    Diabetes Other    Glaucoma Other    Breast cancer Other    Angina Maternal Grandmother    Social History   Tobacco Use   Smoking status: Former    Packs/day: 0.50    Years: 8.00    Pack years: 4.00    Types: Cigarettes    Quit date: 12/19/2011    Years since quitting: 9.4   Smokeless tobacco: Never  Vaping Use   Vaping Use: Never used  Substance Use Topics   Alcohol use: Yes    Comment: SOCIAL   Drug use: No   Allergies  Allergen Reactions   Lidocaine-Epinephrine Rash    Skin irritation    Sulfa Antibiotics Rash    Medications: Outpatient Medications Prior to Visit  Medication Sig   albuterol (VENTOLIN HFA) 108 (90 Base) MCG/ACT inhaler Inhale 1-2 puffs into the lungs every 6 (six) hours as needed for wheezing or  shortness of breath.   Elderberry 575 MG/5ML SYRP    ferrous sulfate 325 (65 FE) MG tablet Take 325 mg by mouth daily with breakfast.   FOLIC ACID PO Take by mouth.   Multiple Vitamins-Minerals (MULTI ADULT GUMMIES) CHEW    phentermine (ADIPEX-P) 37.5 MG tablet Take 1 tablet (37.5 mg total) by mouth daily before breakfast. (Patient taking differently: Take 37.5 mg by mouth daily before breakfast.)   triamterene-hydrochlorothiazide (MAXZIDE-25) 37.5-25 MG tablet Take 1 tablet by mouth daily.   vitamin C (ASCORBIC ACID) 500 MG tablet Take 500 mg by mouth daily.   fluticasone (FLONASE) 50 MCG/ACT nasal spray Place into both nostrils daily.   No facility-administered medications prior to visit.    Review of Systems  Constitutional:  Negative for activity change, appetite change and fatigue.  HENT: Negative.    Respiratory:  Negative for cough.   Cardiovascular:  Negative for chest pain and palpitations.       Objective    BP 117/85 (BP Location: Right Arm, Patient Position: Sitting, Cuff Size: Normal)   Pulse 91   Resp 16   SpO2 100%      Physical Exam Constitutional:      General: She is  not in acute distress.    Appearance: She is well-developed.  HENT:     Head: Normocephalic and atraumatic.     Right Ear: Hearing and tympanic membrane normal.     Left Ear: Hearing and tympanic membrane normal.     Nose: Nose normal.     Mouth/Throat:     Mouth: Mucous membranes are moist.     Pharynx: Oropharynx is clear.  Eyes:     General: Lids are normal. No scleral icterus.       Right eye: No discharge.        Left eye: No discharge.     Extraocular Movements: Extraocular movements intact.     Conjunctiva/sclera: Conjunctivae normal.  Neck:     Vascular: No carotid bruit.  Cardiovascular:     Rate and Rhythm: Normal rate and regular rhythm.     Heart sounds: Normal heart sounds.  Pulmonary:     Effort: Pulmonary effort is normal. No respiratory distress.     Breath sounds:  Normal breath sounds.  Abdominal:     General: Bowel sounds are normal.     Palpations: Abdomen is soft.  Musculoskeletal:        General: Normal range of motion.     Cervical back: Normal range of motion and neck supple.  Lymphadenopathy:     Cervical: No cervical adenopathy.  Skin:    Findings: No lesion or rash.  Neurological:     Mental Status: She is alert and oriented to person, place, and time.     Deep Tendon Reflexes: Reflexes normal.  Psychiatric:        Speech: Speech normal.        Behavior: Behavior normal.        Thought Content: Thought content normal.    Lab Results  Component Value Date   WBC 8.6 07/22/2020   HGB 13.6 07/22/2020   HCT 40.7 07/22/2020   MCV 84 07/22/2020   PLT 301 07/22/2020     Chemistry      Component Value Date/Time   NA 139 07/22/2020 1400   NA 141 05/10/2014 1427   K 4.2 07/22/2020 1400   K 3.5 05/10/2014 1427   CL 103 07/22/2020 1400   CL 106 05/10/2014 1427   CO2 21 07/22/2020 1400   CO2 25 05/10/2014 1427   BUN 14 07/22/2020 1400   BUN 13 05/10/2014 1427   CREATININE 0.93 07/22/2020 1400   CREATININE 0.70 05/10/2014 1427      Component Value Date/Time   CALCIUM 9.0 07/22/2020 1400   CALCIUM 8.9 05/10/2014 1427   ALKPHOS 56 07/22/2020 1400   AST 15 07/22/2020 1400   AST 19 10/18/2012 0923   ALT 14 07/22/2020 1400   BILITOT <0.2 07/22/2020 1400       No results found for any visits on 05/28/21.  Assessment & Plan     1. Sensation of lump in throat Onset several years ago with feeling of more presents recently. No difficulty swallowing or pain with palpation. No specific thyromegaly or nodule identified. Will get labs ordered on 03-22-21 (hgb A1C, iron levels, thyroid panel, B12, Vitamin D and anti-mullerian hormone. May need CT scan if all tests are normal.   No follow-ups on file.      I, Laquanda Bick, PA-C, have reviewed all documentation for this visit. The documentation on 05/28/21 for the exam, diagnosis,  procedures, and orders are all accurate and complete.    Dortha Kern, PA-C  [facility] [phone] (phone) [phone] (fax)  [place]

## 2021-05-29 LAB — HEMOGLOBIN A1C
Est. average glucose Bld gHb Est-mCnc: 100 mg/dL
Hgb A1c MFr Bld: 5.1 % (ref 4.8–5.6)

## 2021-05-29 LAB — T4 AND TSH
T4, Total: 10.1 ug/dL (ref 4.5–12.0)
TSH: 1.55 u[IU]/mL (ref 0.450–4.500)

## 2021-05-29 LAB — VITAMIN D 25 HYDROXY (VIT D DEFICIENCY, FRACTURES): Vit D, 25-Hydroxy: 51.6 ng/mL (ref 30.0–100.0)

## 2021-05-29 LAB — IRON,TIBC AND FERRITIN PANEL
Ferritin: 13 ng/mL — ABNORMAL LOW (ref 15–150)
Iron Saturation: 19 % (ref 15–55)
Iron: 79 ug/dL (ref 27–159)
Total Iron Binding Capacity: 422 ug/dL (ref 250–450)
UIBC: 343 ug/dL (ref 131–425)

## 2021-05-29 LAB — VITAMIN B12: Vitamin B-12: 1211 pg/mL (ref 232–1245)

## 2021-06-03 LAB — ANTI MULLERIAN HORMONE: ANTI-MULLERIAN HORMONE (AMH): 3.23 ng/mL

## 2021-06-15 ENCOUNTER — Encounter: Payer: Self-pay | Admitting: Family Medicine

## 2021-06-16 ENCOUNTER — Other Ambulatory Visit: Payer: Self-pay | Admitting: Family Medicine

## 2021-06-16 DIAGNOSIS — R221 Localized swelling, mass and lump, neck: Secondary | ICD-10-CM

## 2021-06-17 ENCOUNTER — Other Ambulatory Visit: Payer: Self-pay | Admitting: Family Medicine

## 2021-06-17 DIAGNOSIS — R221 Localized swelling, mass and lump, neck: Secondary | ICD-10-CM

## 2021-07-23 ENCOUNTER — Encounter: Payer: 59 | Admitting: Family Medicine

## 2021-09-21 IMAGING — CR DG LUMBAR SPINE COMPLETE 4+V
1 series · 5 of 5 positions shown · non-contrast
Comparison: None.

CLINICAL DATA: Pain in the region of the right SI joint. No injury.
Pain for 2-3 years, but worse over the last 2 weeks.

EXAM:
LUMBAR SPINE - COMPLETE 4+ VIEW

[Series 1: dg lumbar spine complete 4 +v · 0.14mm/px · 5 of 5 slices shown]
[im 1/5]
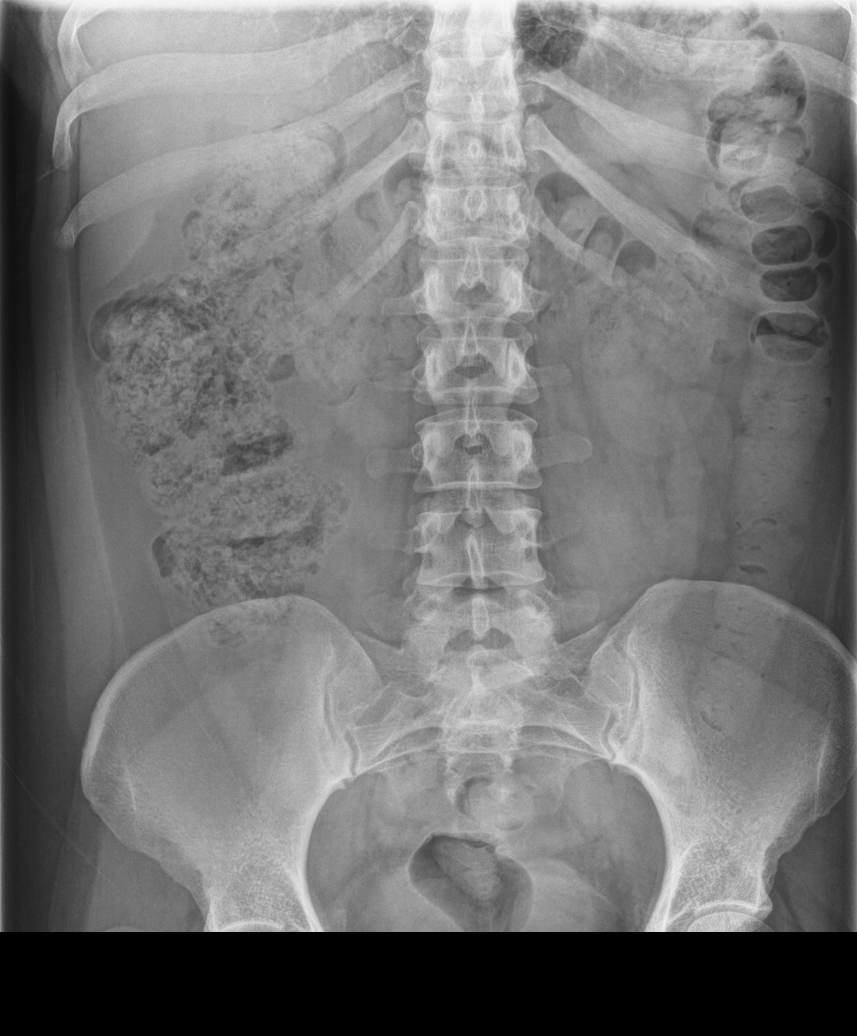
[im 2/5]
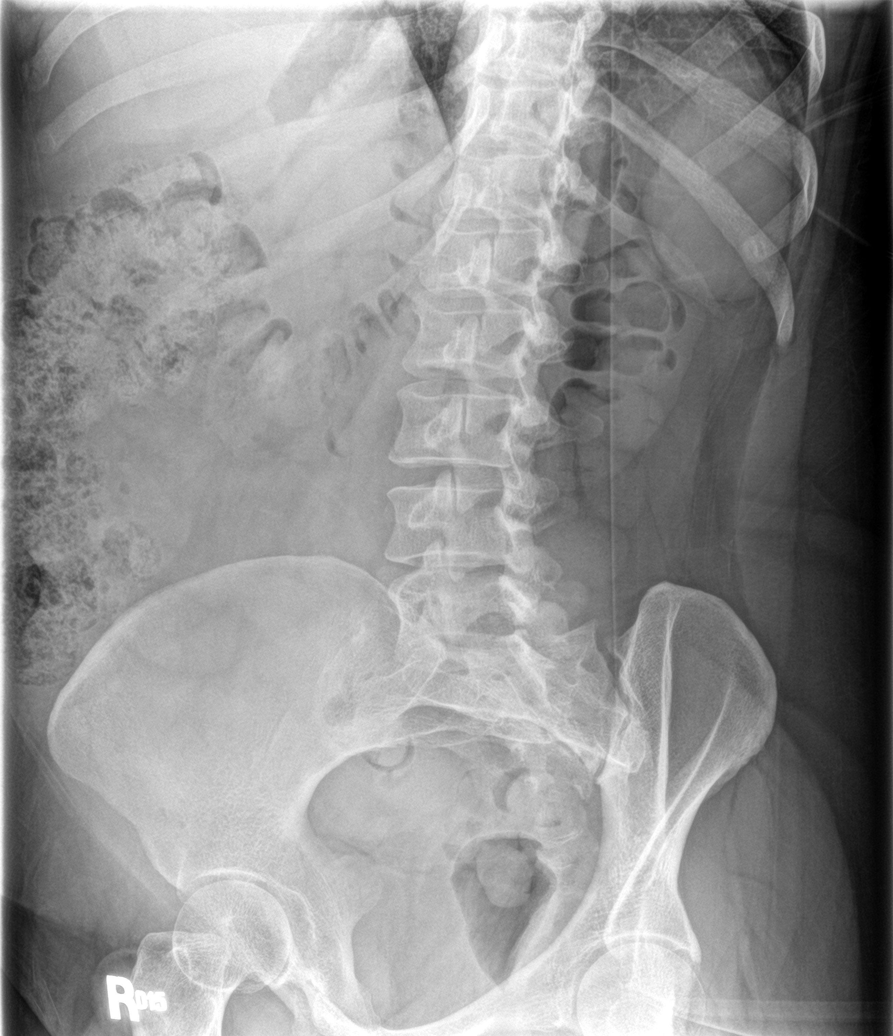
[im 3/5]
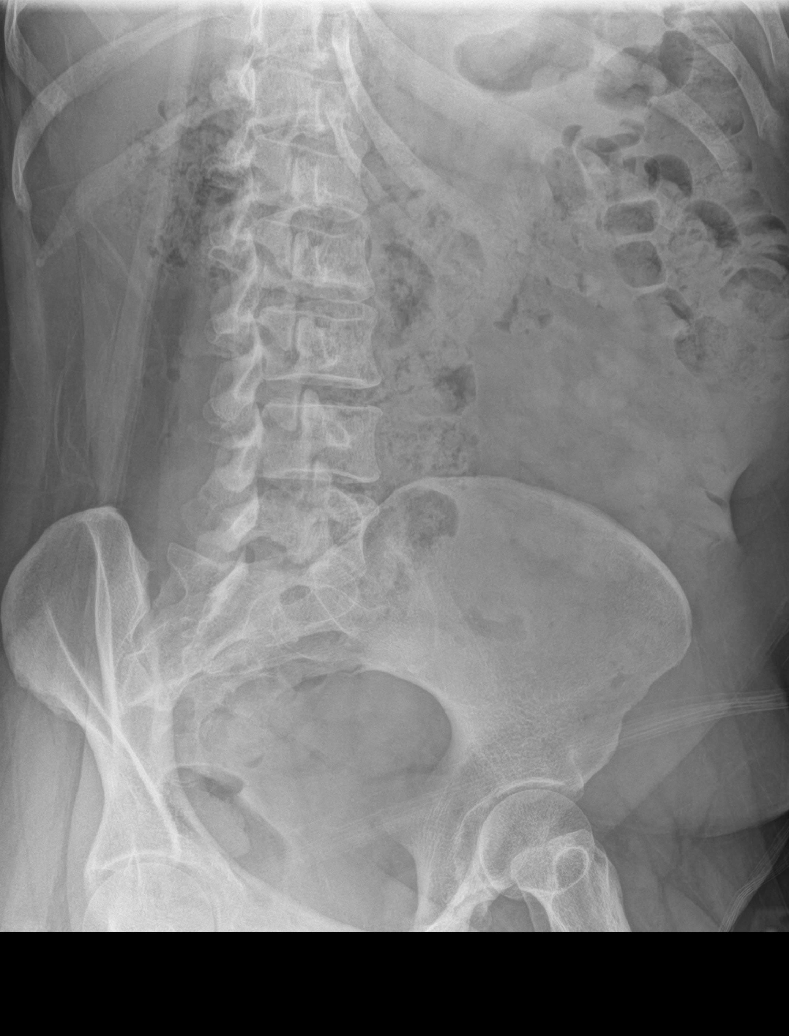
[im 4/5]
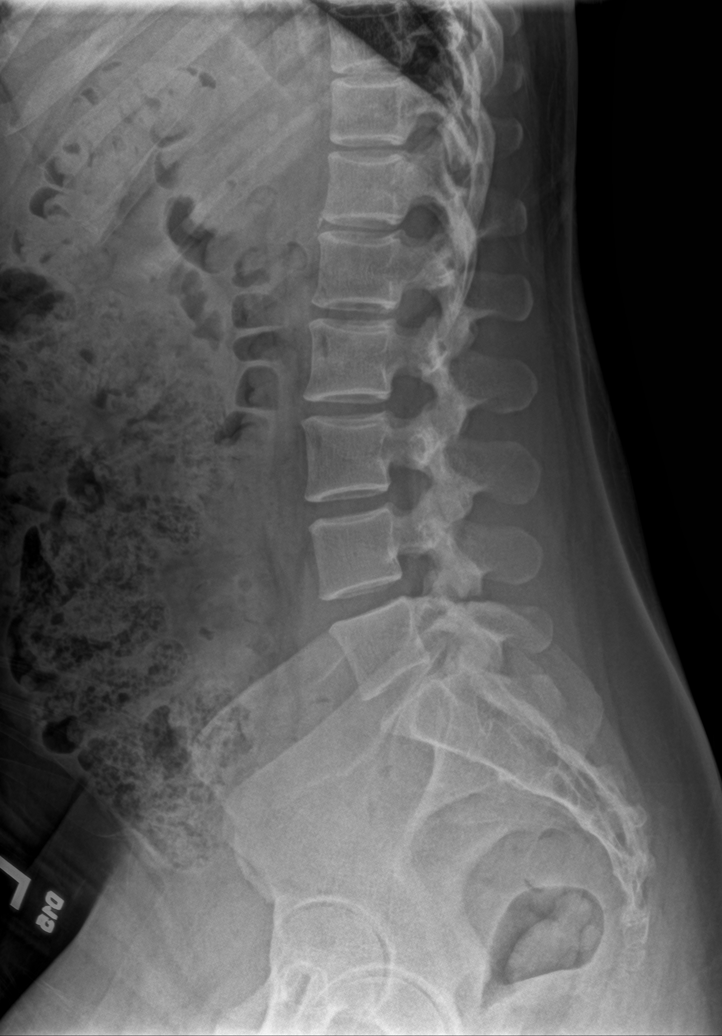
[im 5/5]
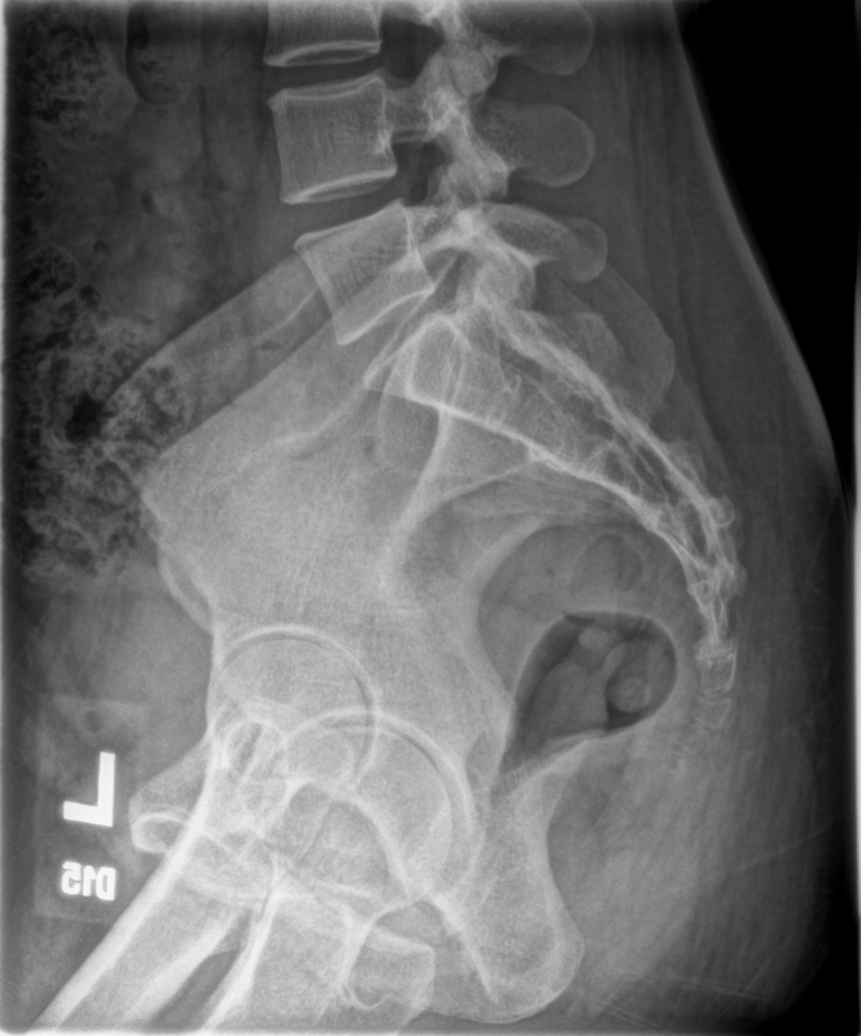

[5 of 5 positions shown; findings below may reference images not displayed]

FINDINGS: No fracture or bone lesion.  No spondylolisthesis.

Disc spaces and facet joints are well maintained. No degenerative
change.

Mild generalized increased colonic stool burden. Soft tissues
otherwise unremarkable.
IMPRESSION: 1. No abnormality of the lumbar spine.
2. Mild generalized increase in the colonic stool burden.

## 2021-09-21 IMAGING — CR DG SI JOINTS 3+V
1 series · 3 of 3 positions shown · non-contrast
Comparison: None.

CLINICAL DATA: Pain in the region of the right SI joint. No injury.
Pain for 2-3 years, but worse over the last 2 weeks.

EXAM:
BILATERAL SACROILIAC JOINTS - 3+ VIEW

[Series 1: dg si joints · 0.14mm/px · 3 of 3 slices shown]
[im 1/3]
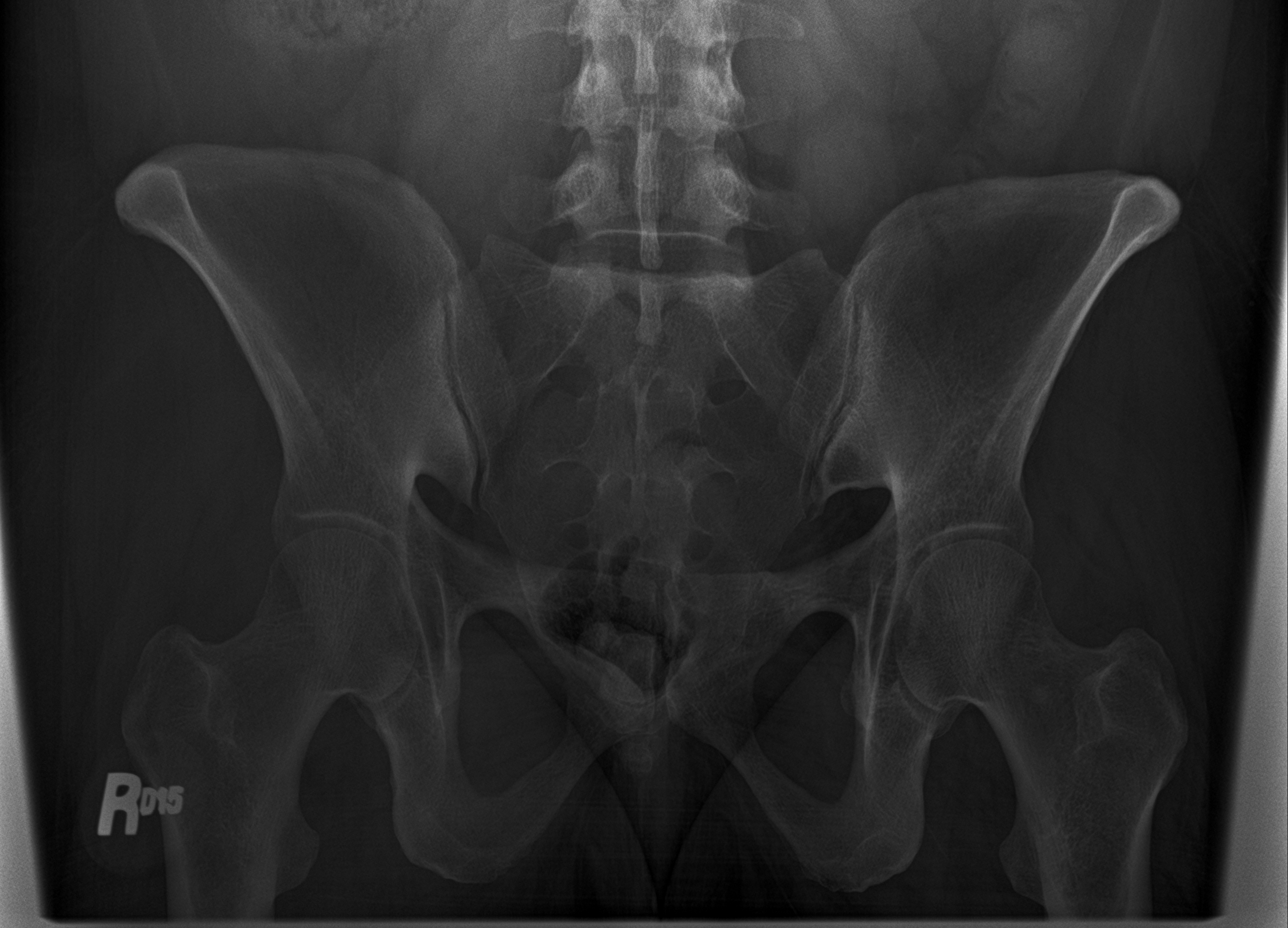
[im 2/3]
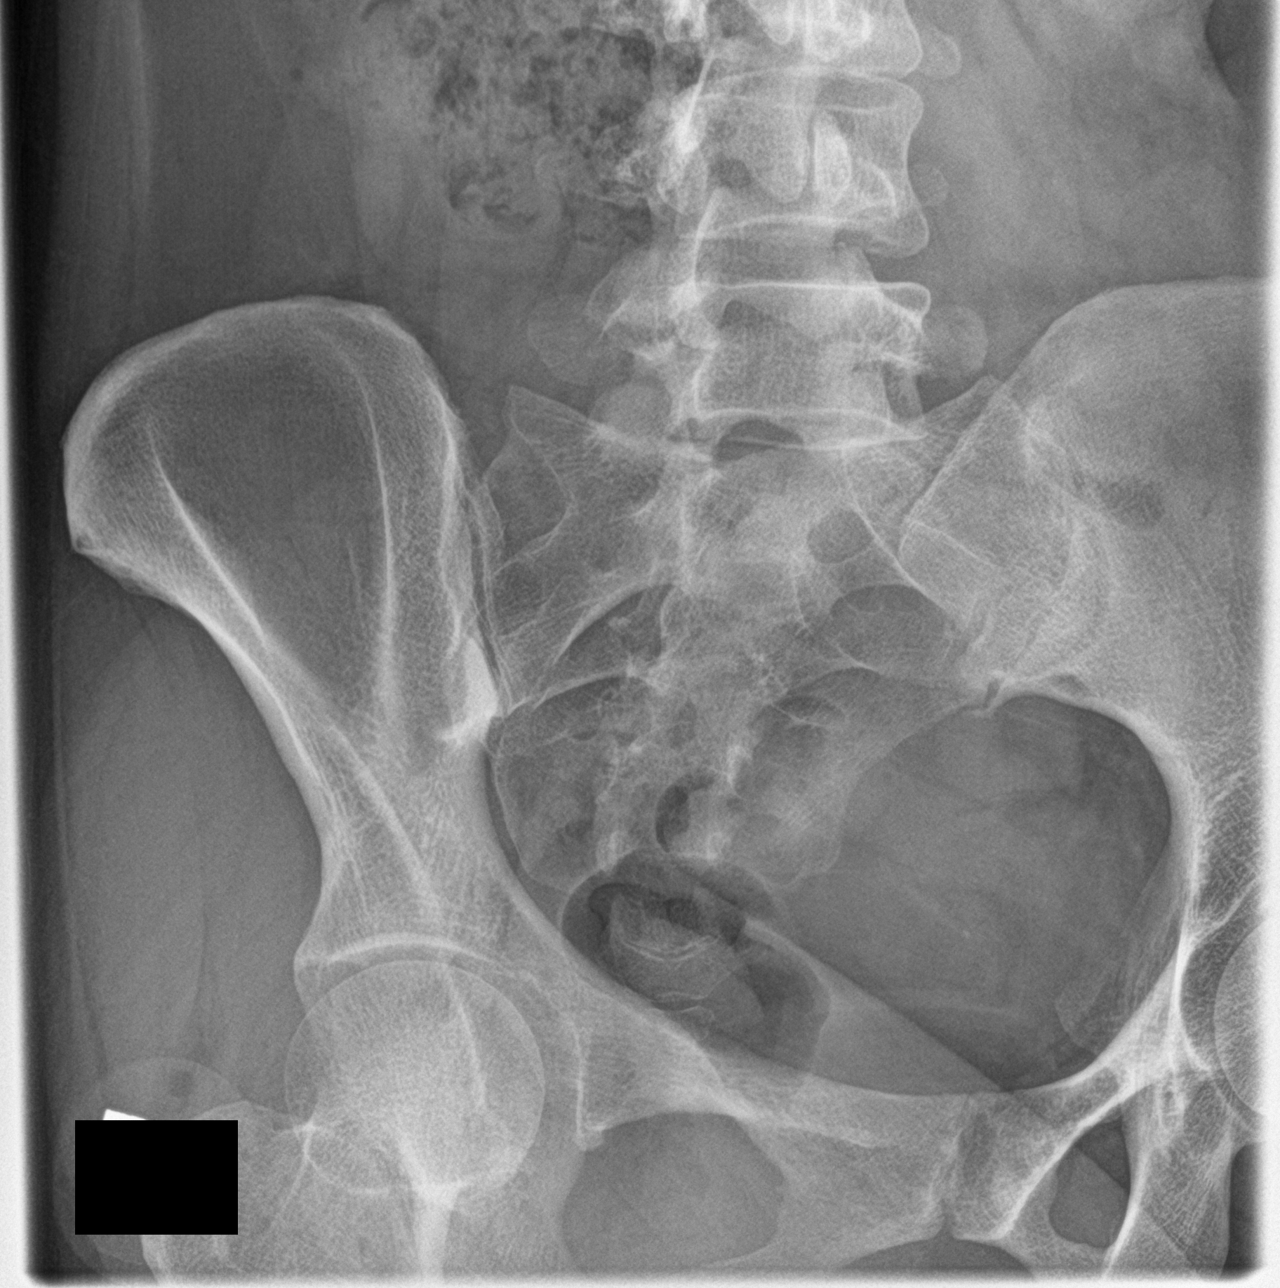
[im 3/3]
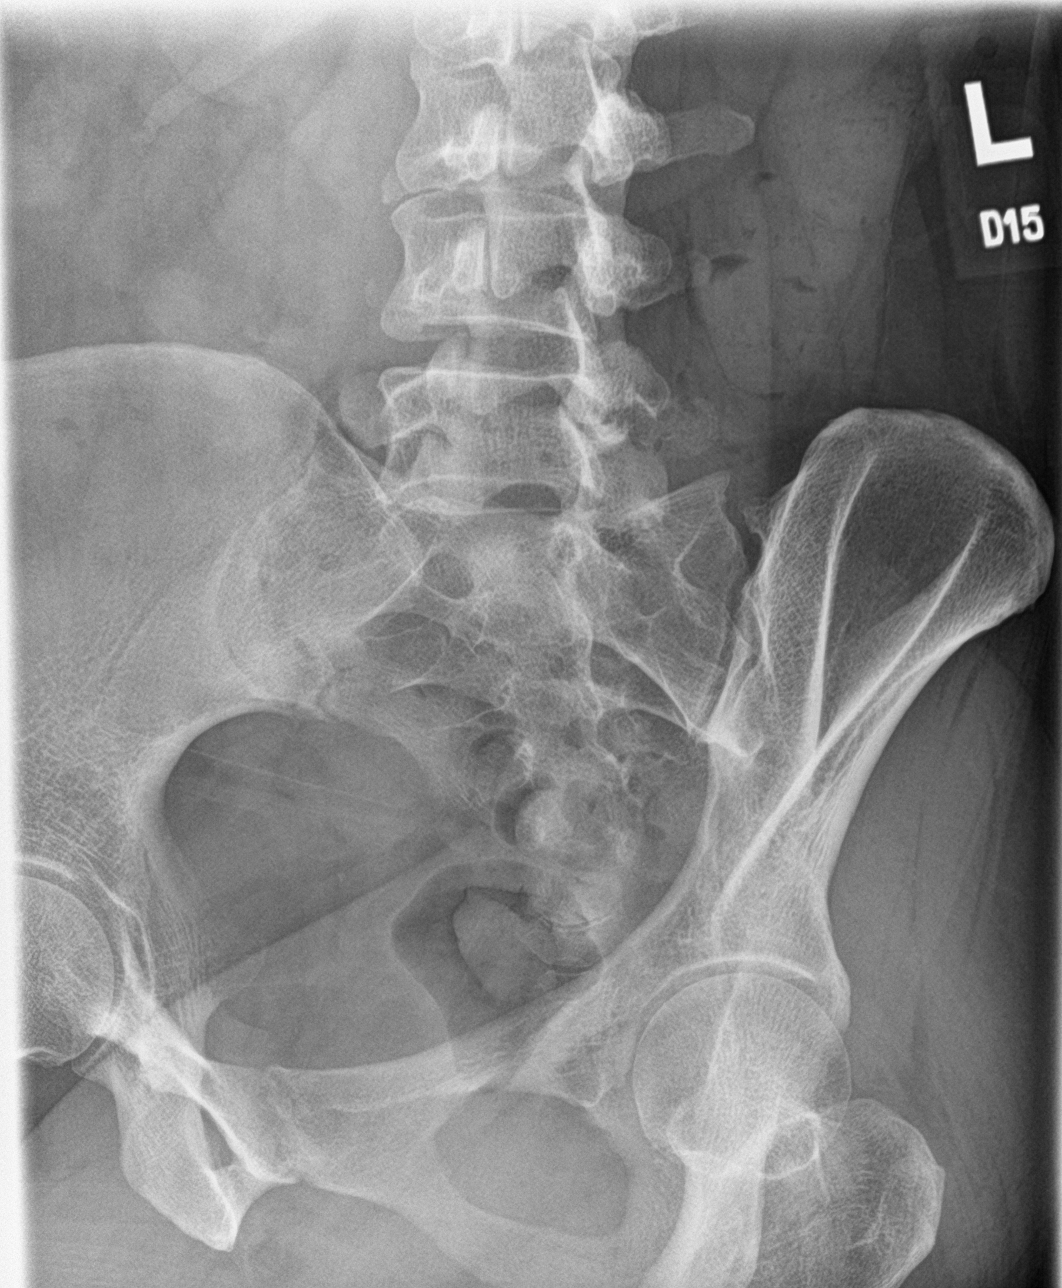

[3 of 3 positions shown; findings below may reference images not displayed]

FINDINGS: The sacroiliac joint spaces are maintained and there is no evidence
of arthropathy. No other bone abnormalities are seen.
IMPRESSION: Negative.

## 2021-10-27 ENCOUNTER — Ambulatory Visit (INDEPENDENT_AMBULATORY_CARE_PROVIDER_SITE_OTHER): Payer: 59

## 2021-10-27 ENCOUNTER — Other Ambulatory Visit: Payer: Self-pay

## 2021-10-27 DIAGNOSIS — Z23 Encounter for immunization: Secondary | ICD-10-CM | POA: Diagnosis not present

## 2021-12-20 ENCOUNTER — Other Ambulatory Visit: Payer: Self-pay | Admitting: Family Medicine

## 2021-12-20 DIAGNOSIS — R0602 Shortness of breath: Secondary | ICD-10-CM

## 2021-12-29 DIAGNOSIS — Z1331 Encounter for screening for depression: Secondary | ICD-10-CM | POA: Diagnosis not present

## 2021-12-29 DIAGNOSIS — R030 Elevated blood-pressure reading, without diagnosis of hypertension: Secondary | ICD-10-CM | POA: Diagnosis not present

## 2021-12-29 DIAGNOSIS — Z8709 Personal history of other diseases of the respiratory system: Secondary | ICD-10-CM | POA: Diagnosis not present

## 2021-12-29 DIAGNOSIS — N920 Excessive and frequent menstruation with regular cycle: Secondary | ICD-10-CM | POA: Diagnosis not present

## 2022-03-21 ENCOUNTER — Other Ambulatory Visit: Payer: Self-pay | Admitting: Family Medicine

## 2022-03-21 DIAGNOSIS — R0602 Shortness of breath: Secondary | ICD-10-CM

## 2022-03-22 NOTE — Telephone Encounter (Signed)
Requested Prescriptions  ?Pending Prescriptions Disp Refills  ?? albuterol (VENTOLIN HFA) 108 (90 Base) MCG/ACT inhaler [Pharmacy Med Name: ALBUTEROL HFA (PROAIR) INHALER] 8.5 each 1  ?  Sig: INHALE 1-2 PUFFS BY MOUTH EVERY 6 HOURS AS NEEDED FOR WHEEZE OR SHORTNESS OF BREATH  ?  ? Pulmonology:  Beta Agonists 2 Passed - 03/21/2022 12:32 PM  ?  ?  Passed - Last BP in normal range  ?  BP Readings from Last 1 Encounters:  ?05/28/21 117/85  ?   ?  ?  Passed - Last Heart Rate in normal range  ?  Pulse Readings from Last 1 Encounters:  ?05/28/21 91  ?   ?  ?  Passed - Valid encounter within last 12 months  ?  Recent Outpatient Visits   ?      ? 9 months ago Sensation of lump in throat  ? [facility] [place], Jodell Cipro, PA-C  ? 1 year ago Elevated blood pressure reading  ? [facility] [place], [place], [place]  ? 1 year ago Need for influenza vaccination  ? [facility] [place], Alessandra Bevels, [place]  ? 1 year ago Annual physical exam  ? [facility] Margaretann Loveless, [place]  ? 2 years ago Viral upper respiratory tract infection  ? [facility] [place], Victorino Dike M, [place]  ?  ?  ? ?  ?  ?  ? ? ?

## 2022-05-12 DIAGNOSIS — R221 Localized swelling, mass and lump, neck: Secondary | ICD-10-CM | POA: Diagnosis not present

## 2022-05-13 ENCOUNTER — Other Ambulatory Visit: Payer: Self-pay | Admitting: Student

## 2022-05-13 DIAGNOSIS — R221 Localized swelling, mass and lump, neck: Secondary | ICD-10-CM

## 2022-05-25 ENCOUNTER — Ambulatory Visit
Admission: RE | Admit: 2022-05-25 | Discharge: 2022-05-25 | Disposition: A | Discharge status: Home / Self Care | Payer: 59 | Source: Ambulatory Visit | Attending: Student | Admitting: Student

## 2022-05-25 DIAGNOSIS — E079 Disorder of thyroid, unspecified: Secondary | ICD-10-CM | POA: Diagnosis not present

## 2022-05-25 DIAGNOSIS — R221 Localized swelling, mass and lump, neck: Secondary | ICD-10-CM | POA: Insufficient documentation

## 2022-06-22 ENCOUNTER — Other Ambulatory Visit: Payer: Self-pay

## 2022-06-22 ENCOUNTER — Emergency Department
Admission: EM | Admit: 2022-06-22 | Discharge: 2022-06-22 | Disposition: A | Discharge status: Home / Self Care | Payer: 59 | Attending: Emergency Medicine | Admitting: Emergency Medicine

## 2022-06-22 ENCOUNTER — Emergency Department: Payer: 59

## 2022-06-22 ENCOUNTER — Encounter: Payer: Self-pay | Admitting: Emergency Medicine

## 2022-06-22 DIAGNOSIS — N888 Other specified noninflammatory disorders of cervix uteri: Secondary | ICD-10-CM | POA: Diagnosis not present

## 2022-06-22 DIAGNOSIS — O26891 Other specified pregnancy related conditions, first trimester: Secondary | ICD-10-CM | POA: Diagnosis not present

## 2022-06-22 DIAGNOSIS — O209 Hemorrhage in early pregnancy, unspecified: Secondary | ICD-10-CM | POA: Diagnosis not present

## 2022-06-22 DIAGNOSIS — R1032 Left lower quadrant pain: Secondary | ICD-10-CM | POA: Diagnosis not present

## 2022-06-22 DIAGNOSIS — Z3A01 Less than 8 weeks gestation of pregnancy: Secondary | ICD-10-CM | POA: Diagnosis not present

## 2022-06-22 DIAGNOSIS — R102 Pelvic and perineal pain: Secondary | ICD-10-CM | POA: Diagnosis not present

## 2022-06-22 LAB — BASIC METABOLIC PANEL
Anion gap: 11 (ref 5–15)
BUN: 13 mg/dL (ref 6–20)
CO2: 22 mmol/L (ref 22–32)
Calcium: 9 mg/dL (ref 8.9–10.3)
Chloride: 106 mmol/L (ref 98–111)
Creatinine, Ser: 0.66 mg/dL (ref 0.44–1.00)
GFR, Estimated: >60 mL/min (ref >60)
Glucose, Bld: 89 mg/dL (ref 70–99)
Potassium: 4.1 mmol/L (ref 3.5–5.1)
Sodium: 139 mmol/L (ref 135–145)

## 2022-06-22 LAB — CBC WITH DIFFERENTIAL/PLATELET
Abs Immature Granulocytes: 0.03 10*3/uL (ref 0.00–0.07)
Basophils Absolute: 0 10*3/uL (ref 0.0–0.1)
Basophils Relative: 0 %
Eosinophils Absolute: 0.3 10*3/uL (ref 0.0–0.5)
Eosinophils Relative: 4 %
HCT: 42.6 % (ref 36.0–46.0)
Hemoglobin: 14 g/dL (ref 12.0–15.0)
Immature Granulocytes: 0 %
Lymphocytes Relative: 25 %
Lymphs Abs: 1.9 10*3/uL (ref 0.7–4.0)
MCH: 27.9 pg (ref 26.0–34.0)
MCHC: 32.9 g/dL (ref 30.0–36.0)
MCV: 84.9 fL (ref 80.0–100.0)
Monocytes Absolute: 0.5 10*3/uL (ref 0.1–1.0)
Monocytes Relative: 7 %
Neutro Abs: 4.8 10*3/uL (ref 1.7–7.7)
Neutrophils Relative %: 64 %
Platelets: 231 10*3/uL (ref 150–400)
RBC: 5.02 MIL/uL (ref 3.87–5.11)
RDW: 13.5 % (ref 11.5–15.5)
WBC: 7.5 10*3/uL (ref 4.0–10.5)
nRBC: 0 % (ref 0.0–0.2)

## 2022-06-22 LAB — HCG, QUANTITATIVE, PREGNANCY: hCG, Beta Chain, Quant, S: 28648 m[IU]/mL — ABNORMAL HIGH (ref <5)

## 2022-06-22 LAB — URINALYSIS, ROUTINE W REFLEX MICROSCOPIC
Bilirubin Urine: NEGATIVE
Glucose, UA: NEGATIVE mg/dL
Hgb urine dipstick: NEGATIVE
Ketones, ur: NEGATIVE mg/dL
Leukocytes,Ua: NEGATIVE
Nitrite: NEGATIVE
Protein, ur: NEGATIVE mg/dL
Specific Gravity, Urine: 1.015 (ref 1.005–1.030)
pH: 6 (ref 5.0–8.0)

## 2022-06-22 LAB — POC URINE PREG, ED: Preg Test, Ur: POSITIVE — AB

## 2022-06-22 NOTE — ED Notes (Signed)
Pt sitting up in bed, pt denies pain at this time, pt states that she is ready to go home, pt ambulatory from dpt with family

## 2022-06-22 NOTE — ED Provider Notes (Signed)
[facility] Provider Note    Event Date/Time   First MD Initiated Contact with Patient 06/22/22 1349     (approximate)   History   Chief Complaint Abdominal Pain   HPI  Wendy Hunt is a 36 y.o. female, G2P0101 at approximately 7 weeks of pregnancy who presents to the ED complaining of abdominal pain.  Patient reports that she has been dealing with a couple of weeks of intermittent sharp pain in the left lower quadrant of her abdomen.  Pain has not seem to be exacerbated or alleviated by anything in particular and had been relatively mild until the past 2 days.  She now states that it has been increasingly sharp and severe, but she denies any vaginal bleeding, discharge, nausea, vomiting, dysuria, or flank pain.  She spoke with her OB/GYN's office, who recommended she come to the ED for further evaluation.  She has not yet had an ultrasound this pregnancy.     Physical Exam   Triage Vital Signs: ED Triage Vitals  Enc Vitals Group     BP 06/22/22 1005 (!) 131/97     Pulse Rate 06/22/22 1005 (!) 107     Resp 06/22/22 1005 18     Temp 06/22/22 1005 98.8 F (37.1 C)     Temp Source 06/22/22 1005 Oral     SpO2 06/22/22 1005 100 %     Weight 06/22/22 1006 138 lb (62.6 kg)     Height 06/22/22 1006 4\' 11"  (1.499 m)     Head Circumference --      Peak Flow --      Pain Score 06/22/22 1005 6     Pain Loc --      Pain Edu? --      Excl. in GC? --     Most recent vital signs: Vitals:   06/22/22 1005 06/22/22 1353  BP: (!) 131/97 113/82  Pulse: (!) 107 73  Resp: 18 18  Temp: 98.8 F (37.1 C)   SpO2: 100% 99%    Constitutional: Alert and oriented. Eyes: Conjunctivae are normal. Head: Atraumatic. Nose: No congestion/rhinnorhea. Mouth/Throat: Mucous membranes are moist.  Cardiovascular: Normal rate, regular rhythm. Grossly normal heart sounds.  2+ radial pulses bilaterally. Respiratory: Normal respiratory effort.  No retractions. Lungs  CTAB. Gastrointestinal: Soft and nontender. No distention. Musculoskeletal: No lower extremity tenderness nor edema.  Neurologic:  Normal speech and language. No gross focal neurologic deficits are appreciated.    ED Results / Procedures / Treatments   Labs (all labs ordered are listed, but only abnormal results are displayed) Labs Reviewed  URINALYSIS, ROUTINE W REFLEX MICROSCOPIC - Abnormal; Notable for the following components:      Result Value   Color, Urine YELLOW (*)    APPearance CLEAR (*)    All other components within normal limits  HCG, QUANTITATIVE, PREGNANCY - Abnormal; Notable for the following components:   hCG, Beta Chain, Quant, S M (*)    All other components within normal limits  POC URINE PREG, ED - Abnormal; Notable for the following components:   Preg Test, Ur POSITIVE (*)    All other components within normal limits  BASIC METABOLIC PANEL  CBC WITH DIFFERENTIAL/PLATELET   RADIOLOGY Obstetric ultrasound reviewed and interpreted by me with intrauterine gestational and yolk sac noted.  PROCEDURES:  Critical Care performed: No  Procedures   MEDICATIONS ORDERED IN ED: Medications - No data to display   IMPRESSION / MDM / ASSESSMENT AND  PLAN / ED COURSE  I reviewed the triage vital signs and the nursing notes.                              36 y.o. female G2P0101 at approximately 7 weeks of pregnancy who presents to the ED complaining of intermittent sharp pain in the left lower quadrant of her abdomen for the past 2 weeks that has gotten more severe over the past 2 days.  Patient's presentation is most consistent with acute presentation with potential threat to life or bodily function.  Differential diagnosis includes, but is not limited to, ectopic pregnancy, miscarriage, abdominal pain in pregnancy, UTI, diverticulitis, kidney stone.  Patient well-appearing and in no acute distress, vital signs are unremarkable and she has a benign abdominal  exam.  Beta hCG is appropriately elevated and obstetric ultrasound shows intrauterine pregnancy, although it does not appear to have progressed as far as her dates would suggest.  Range of work-up is reassuring with no signs of UTI, no significant leukocytosis, anemia, AKI, or electrolyte abnormality.  No symptoms to suggest kidney stone or diverticulitis, patient is appropriate for discharge home with OB/GYN follow-up.  She was counseled to take Tylenol as needed and discussed repeat ultrasound in about 2 weeks with her OB/GYN.  She was counseled to return to the ED for new or worsening symptoms, patient agrees with plan.      FINAL CLINICAL IMPRESSION(S) / ED DIAGNOSES   Final diagnoses:  Vaginal bleeding affecting early pregnancy  Pelvic pain     Rx / DC Orders   ED Discharge Orders     None        Note:  This document was prepared using Dragon voice recognition software and may include unintentional dictation errors.   Chesley Noon, MD 06/22/22 1414

## 2022-06-22 NOTE — ED Triage Notes (Signed)
Patient to ED via POV for left sided abd pain. Patient states she will be [redacted] weeks pregnant tomorrow and denies vomiting or bleeding. Ambulatory upon triage.

## 2022-06-22 NOTE — ED Provider Triage Note (Signed)
Emergency Medicine Provider Triage Evaluation Note  Wendy Hunt , a 36 y.o. female  was evaluated in triage.  Pt complains of left side pelvic pain. She is about [redacted] weeks pregnant. No vaginal bleeding or discharge. No fever.    Physical Exam  There were no vitals taken for this visit. Gen:   Awake, no distress   Resp:  Normal effort  MSK:   Moves extremities without difficulty  Other:    Medical Decision Making  Medically screening exam initiated at 10:05 AM.  Appropriate orders placed.  Wendy Hunt was informed that the remainder of the evaluation will be completed by another provider, this initial triage assessment does not replace that evaluation, and the importance of remaining in the ED until their evaluation is complete.     Chinita Pester, FNP 06/22/22 1006

## 2022-06-22 NOTE — ED Notes (Signed)
Reports left sided abdomen pain. Reports being [redacted] weeks pregnant.

## 2022-06-28 DIAGNOSIS — N912 Amenorrhea, unspecified: Secondary | ICD-10-CM | POA: Insufficient documentation

## 2022-07-06 DIAGNOSIS — O3680X0 Pregnancy with inconclusive fetal viability, not applicable or unspecified: Secondary | ICD-10-CM | POA: Diagnosis not present

## 2022-07-06 DIAGNOSIS — O021 Missed abortion: Secondary | ICD-10-CM | POA: Diagnosis not present

## 2022-07-06 DIAGNOSIS — N912 Amenorrhea, unspecified: Secondary | ICD-10-CM | POA: Diagnosis not present

## 2022-07-08 DIAGNOSIS — O3680X0 Pregnancy with inconclusive fetal viability, not applicable or unspecified: Secondary | ICD-10-CM | POA: Diagnosis not present

## 2022-07-20 DIAGNOSIS — O3680X0 Pregnancy with inconclusive fetal viability, not applicable or unspecified: Secondary | ICD-10-CM | POA: Diagnosis not present

## 2022-07-27 DIAGNOSIS — O3680X0 Pregnancy with inconclusive fetal viability, not applicable or unspecified: Secondary | ICD-10-CM | POA: Diagnosis not present

## 2022-08-03 DIAGNOSIS — O021 Missed abortion: Secondary | ICD-10-CM | POA: Diagnosis not present

## 2022-08-10 DIAGNOSIS — O021 Missed abortion: Secondary | ICD-10-CM | POA: Diagnosis not present

## 2022-08-17 DIAGNOSIS — O021 Missed abortion: Secondary | ICD-10-CM | POA: Diagnosis not present

## 2022-11-02 DIAGNOSIS — R599 Enlarged lymph nodes, unspecified: Secondary | ICD-10-CM | POA: Diagnosis not present

## 2022-11-12 ENCOUNTER — Ambulatory Visit
Admission: EM | Admit: 2022-11-12 | Discharge: 2022-11-12 | Disposition: A | Discharge status: Home / Self Care | Payer: 59

## 2022-11-12 DIAGNOSIS — R102 Pelvic and perineal pain unspecified side: Secondary | ICD-10-CM | POA: Insufficient documentation

## 2022-11-12 DIAGNOSIS — J45901 Unspecified asthma with (acute) exacerbation: Secondary | ICD-10-CM | POA: Diagnosis not present

## 2022-11-12 MED ORDER — PREDNISONE 20 MG PO TABS: 60.0000 mg | ORAL_TABLET | Freq: Every day | ORAL | 0 refills | Status: AC

## 2022-11-12 MED ORDER — METHYLPREDNISOLONE SODIUM SUCC 125 MG IJ SOLR
60.0000 mg | Freq: Once | INTRAMUSCULAR | Status: AC
  Administered 2022-11-12: 60 mg via INTRAMUSCULAR

## 2022-11-12 MED ORDER — METHYLPREDNISOLONE SODIUM SUCC 125 MG IJ SOLR: 60.0000 mg | Freq: Once | INTRAMUSCULAR | Status: DC

## 2022-11-12 NOTE — ED Provider Notes (Addendum)
Wendy Hunt    CSN: [id number] Arrival date & time: 11/12/22  0801      History   Chief Complaint Chief Complaint  Patient presents with   Wheezing   Cough    HPI Wendy Hunt is a 36 y.o. female.    Wheezing Associated symptoms: cough   Cough Associated symptoms: wheezing     Presents to urgent care with complaint of wheezing, crackles in her lungs, and cough x 1 week.  Patient states she had a sinus infection about 1 week ago and symptoms have worsened.  She denies history of current asthma though she endorses asthma as a child.  She refers to "reactive airway" and has prescription for albuterol inhaler which she states she got around the time that she had COVID "the first time".  She is occasional smoker which she restarted over the summer following miscarriage.  She states she has recently been smoking about 2 cigarettes/day but denies smoking in the past week while having the symptoms.  Past Medical History:  Diagnosis Date   Anemia    Palpitations    RAD (reactive airway disease) with wheezing    Tachycardia     Patient Active Problem List   Diagnosis Date Noted   Pelvic pain in female 11/12/2022   Amenorrhea 06/28/2022   Constipation 06/11/2020   Grade I internal hemorrhoids 06/11/2020   Personal history of COVID-19 05/26/2020   Inappropriate sinus tachycardia 06/04/2019   Anemia, iron deficiency 07/17/2015   Excess, menstruation 07/17/2015   RAD (reactive airway disease) 07/17/2015   Hemorrhoids, external without complications 07/17/2015   Airway hyperreactivity 07/17/2015    Past Surgical History:  Procedure Laterality Date   CESAREAN SECTION  November 2013   HYSTEROSCOPY WITH D & C  12/09/2019   Procedure: DILATATION AND CURETTAGE /HYSTEROSCOPY;  Surgeon: Schermerhorn, Ihor Austin, MD;  Location: [facility] ORS;  Service: Gynecology;;   WISDOM TOOTH EXTRACTION  2003    OB History     Gravida  1   Para  1   Term      Preterm       AB      Living         SAB      IAB      Ectopic      Multiple      Live Births               Home Medications    Prior to Admission medications   Medication Sig Start Date End Date Taking? Authorizing Provider  Ferrous Sulfate (IRON PO) Take 1 tablet by mouth daily. 07/14/22 07/14/23 Yes [provider]  predniSONE (DELTASONE) 20 MG tablet Take 3 tablets (60 mg total) by mouth daily with breakfast for 5 days. 11/12/22 11/17/22 Yes Lewin Pellow, Jeannett Senior, FNP  albuterol (VENTOLIN HFA) 108 (90 Base) MCG/ACT inhaler INHALE 1-2 PUFFS BY MOUTH EVERY 6 HOURS AS NEEDED FOR WHEEZE OR SHORTNESS OF BREATH 03/22/22   Bacigalupo, Marzella Schlein, MD  Elderberry 575 MG/5ML SYRP     [provider]  ferrous sulfate 325 (65 FE) MG tablet Take 325 mg by mouth daily with breakfast.    [provider]  fluticasone (FLONASE) 50 MCG/ACT nasal spray Place into both nostrils daily.    [provider]  FOLIC ACID PO Take by mouth.    [provider]  Multiple Vitamins-Minerals (MULTI ADULT GUMMIES) CHEW     [provider]  phentermine (ADIPEX-P) 37.5 MG tablet  Take 1 tablet (37.5 mg total) by mouth daily before breakfast. Patient taking differently: Take 37.5 mg by mouth daily before breakfast. 05/22/20   Burnette, Alessandra Bevels, PA-C  triamterene-hydrochlorothiazide (MAXZIDE-25) 37.5-25 MG tablet Take 1 tablet by mouth daily. 03/22/21   Margaretann Loveless, PA-C  vitamin C (ASCORBIC ACID) 500 MG tablet Take 500 mg by mouth daily.    [provider]    Family History Family History  Problem Relation Age of Onset   Healthy Mother    Hyperlipidemia Mother    Healthy Father        Pre-DM and hypochondriac   Diabetes Maternal Grandfather    Diabetes Paternal Grandmother    Breast cancer Paternal Grandmother    Esophageal cancer Paternal Grandfather    Diabetes Other    Glaucoma Other    Breast cancer Other    Angina Maternal Grandmother      Social History Social History   Tobacco Use   Smoking status: Former    Packs/day: 0.50    Years: 8.00    Total pack years: 4.00    Types: Cigarettes    Quit date: 12/19/2011    Years since quitting: 10.9   Smokeless tobacco: Never  Vaping Use   Vaping Use: Never used  Substance Use Topics   Alcohol use: Yes    Comment: SOCIAL   Drug use: No     Allergies   Lidocaine-epinephrine and Sulfa antibiotics   Review of Systems Review of Systems  Respiratory:  Positive for cough and wheezing.      Physical Exam Triage Vital Signs ED Triage Vitals  Enc Vitals Group     BP 11/12/22 0812 118/87     Pulse Rate 11/12/22 0812 (!) 101     Resp 11/12/22 0812 16     Temp 11/12/22 0812 97.9 F (36.6 C)     Temp src --      SpO2 11/12/22 0812 97 %     Weight --      Height --      Head Circumference --      Peak Flow --      Pain Score 11/12/22 0813 0     Pain Loc --      Pain Edu? --      Excl. in GC? --    No data found.  Updated Vital Signs BP 118/87   Pulse (!) 101   Temp 97.9 F (36.6 C)   Resp 16   LMP 11/12/2022 (Approximate)   SpO2 97%   Visual Acuity Right Eye Distance:   Left Eye Distance:   Bilateral Distance:    Right Eye Near:   Left Eye Near:    Bilateral Near:     Physical Exam Vitals reviewed.  Constitutional:      Appearance: Normal appearance.  Cardiovascular:     Rate and Rhythm: Normal rate and regular rhythm.  Pulmonary:     Effort: Pulmonary effort is normal.     Breath sounds: Wheezing present.  Skin:    General: Skin is warm and dry.  Neurological:     General: No focal deficit present.     Mental Status: She is alert and oriented to person, place, and time.  Psychiatric:        Mood and Affect: Mood normal.        Behavior: Behavior normal.      UC Treatments / Results  Labs (all labs ordered are listed, but only abnormal  results are displayed) Labs Reviewed - No data to display  EKG   Radiology No  results found.  Procedures Procedures (including critical care time)  Medications Ordered in UC Medications  methylPREDNISolone sodium succinate (SOLU-MEDROL) 125 mg/2 mL injection 60 mg (has no administration in time range)    Initial Impression / Assessment and Plan / UC Course  I have reviewed the triage vital signs and the nursing notes.  Pertinent labs & imaging results that were available during my care of the patient were reviewed by me and considered in my medical decision making (see chart for details).   Patient is afebrile here without recent antipyretics. Satting well on room air. Overall is well appearing, well hydrated, without respiratory distress. Pulmonary exam is remarkable for severe wheezes in all lobes.   Suspect acute exacerbation of "reactive airway". Will admin bronchodilator via neb (albuterol 2.5/3 ml) and assess response.  Good response to albuterol with improved lung sounds. Will discharge patient after admin of solu-medrol 60 mg IM and prednisone 60 mg PO x 5 days for home use. Recommend patient f/up with her PCP for any changes in chronic maintenance plan.   Final Clinical Impressions(s) / UC Diagnoses   Final diagnoses:  Asthma with acute exacerbation, unspecified asthma severity, unspecified whether persistent   Discharge Instructions   None    ED Prescriptions     Medication Sig Dispense Auth. Provider   predniSONE (DELTASONE) 20 MG tablet Take 3 tablets (60 mg total) by mouth daily with breakfast for 5 days. 15 tablet Nickoles Gregori, FNP      PDMP not reviewed this encounter.   Charma Igo, FNP 11/12/22 0837    Charma Igo, FNP 11/30/22 [phone]

## 2022-11-12 NOTE — ED Triage Notes (Signed)
Pt. Presnets to UC w/ c/o wheezing, crackles and a cough for the past week. Pt. States it started w/ a sinus infection but has worsened.

## 2022-11-12 NOTE — Discharge Instructions (Signed)
Follow up here or with your primary care provider if your symptoms are worsening or not improving with treatment.     

## 2022-11-15 DIAGNOSIS — Z419 Encounter for procedure for purposes other than remedying health state, unspecified: Secondary | ICD-10-CM | POA: Diagnosis not present

## 2022-11-15 DIAGNOSIS — D2239 Melanocytic nevi of other parts of face: Secondary | ICD-10-CM | POA: Diagnosis not present

## 2022-11-15 DIAGNOSIS — D225 Melanocytic nevi of trunk: Secondary | ICD-10-CM | POA: Diagnosis not present

## 2022-11-15 DIAGNOSIS — D2272 Melanocytic nevi of left lower limb, including hip: Secondary | ICD-10-CM | POA: Diagnosis not present

## 2022-11-15 DIAGNOSIS — D2262 Melanocytic nevi of left upper limb, including shoulder: Secondary | ICD-10-CM | POA: Diagnosis not present

## 2022-11-15 DIAGNOSIS — D2271 Melanocytic nevi of right lower limb, including hip: Secondary | ICD-10-CM | POA: Diagnosis not present

## 2022-11-15 DIAGNOSIS — D2261 Melanocytic nevi of right upper limb, including shoulder: Secondary | ICD-10-CM | POA: Diagnosis not present

## 2022-11-15 DIAGNOSIS — L578 Other skin changes due to chronic exposure to nonionizing radiation: Secondary | ICD-10-CM | POA: Diagnosis not present

## 2022-11-15 DIAGNOSIS — L821 Other seborrheic keratosis: Secondary | ICD-10-CM | POA: Diagnosis not present

## 2022-11-22 IMAGING — US US SOFT TISSUE HEAD/NECK
1 series · 14 of 22 positions shown · non-contrast
Comparison: CT head, 09/22/2009.

CLINICAL DATA: fullness on left side lateral to thyroid

EXAM:
ULTRASOUND OF HEAD/NECK SOFT TISSUES
TECHNIQUE: Ultrasound examination of the head and neck soft tissues was
performed in the area of clinical concern.

[Series 1: us soft tissue head & neck (non-thyroid) · 14 of 22 slices shown]
[im 1/22]
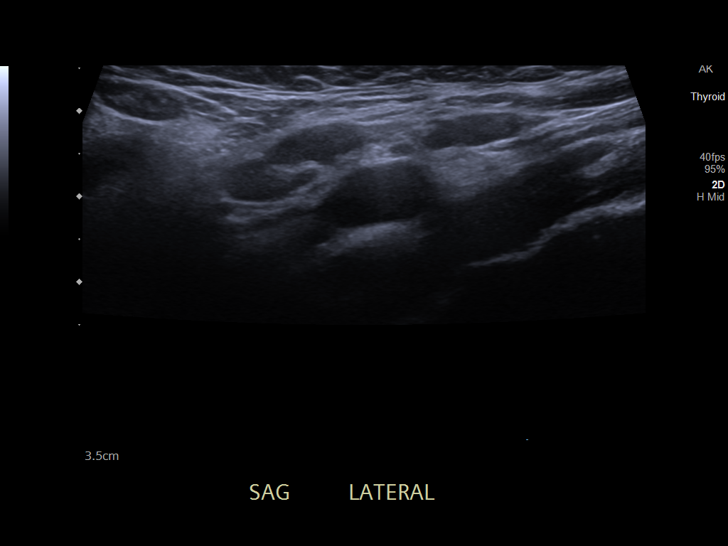
[im 3/22]
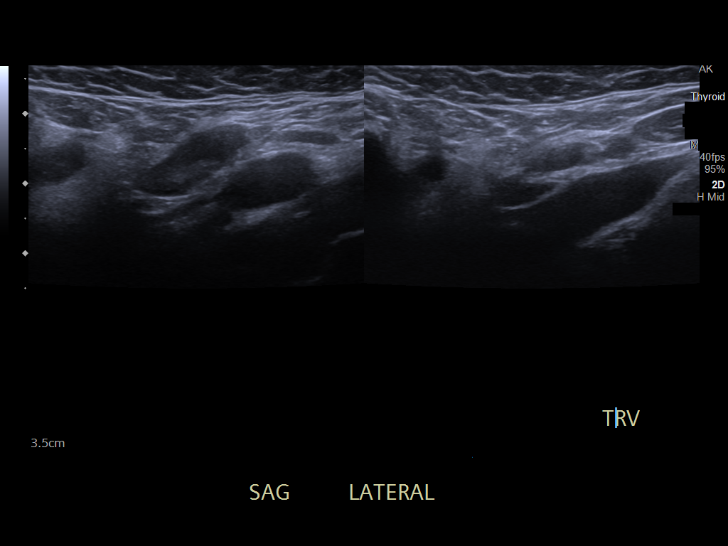
[im 4/22]
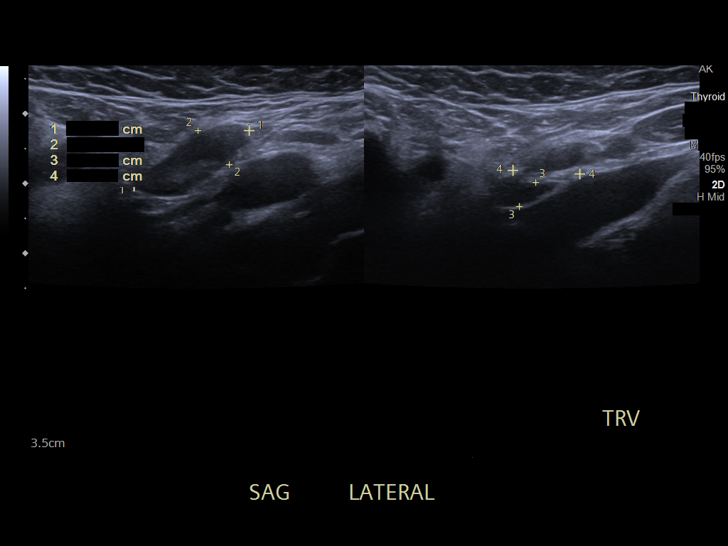
[im 6/22]
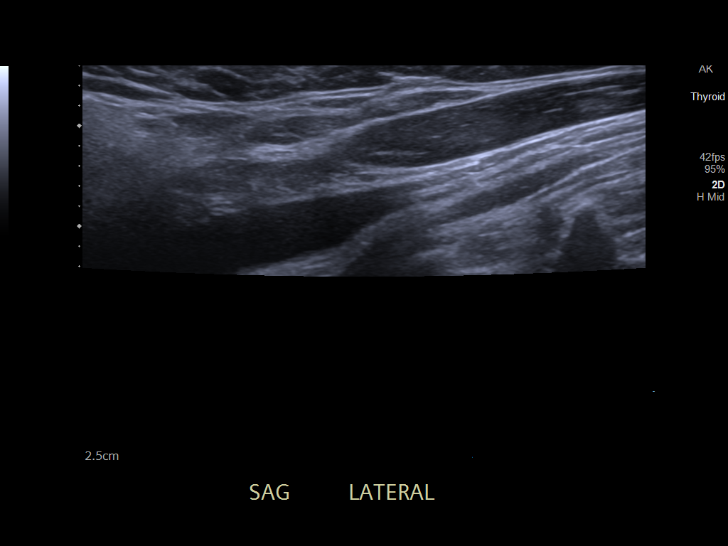
[im 8/22]
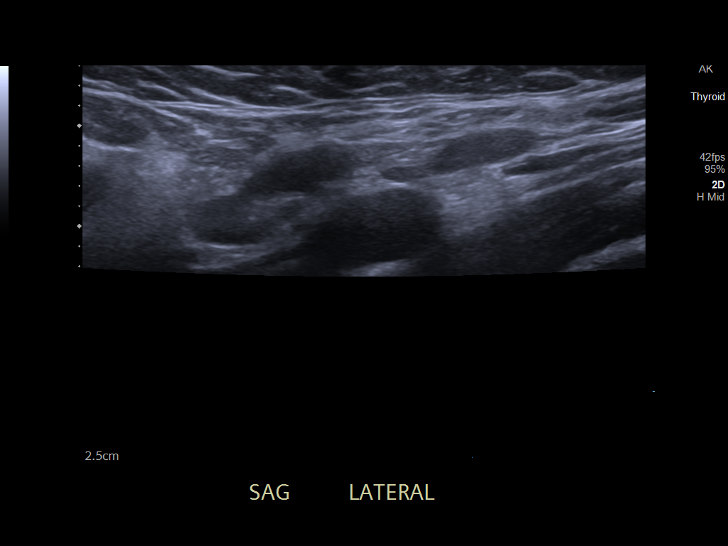
[im 9/22]
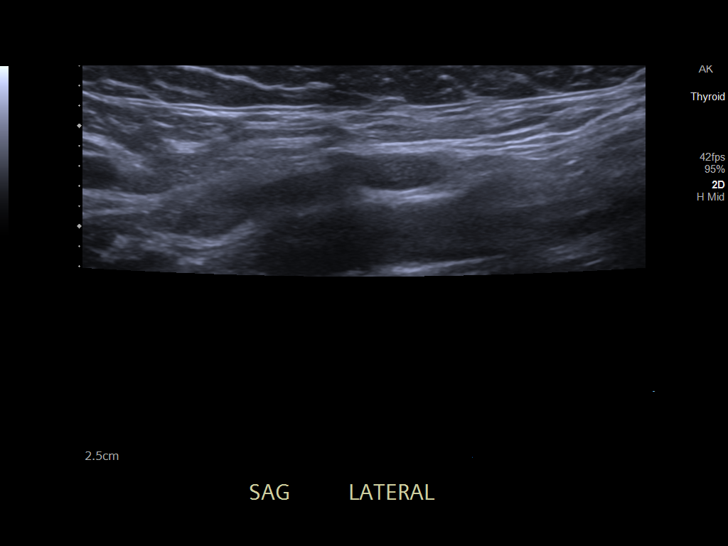
[im 11/22]
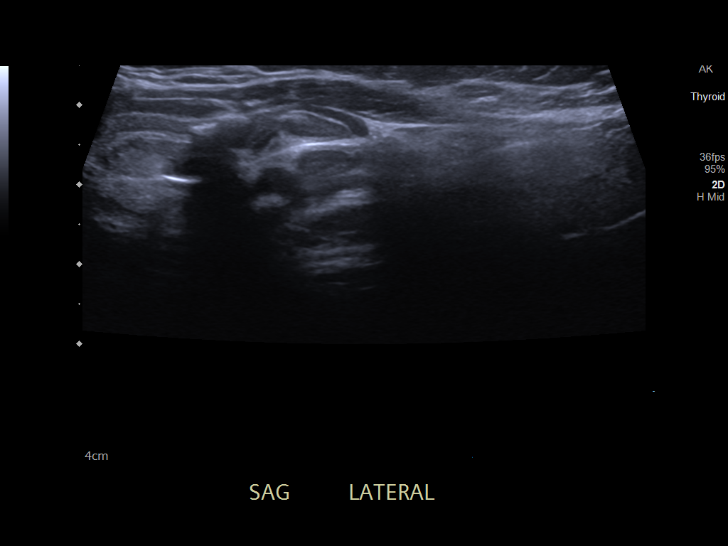
[im 12/22]
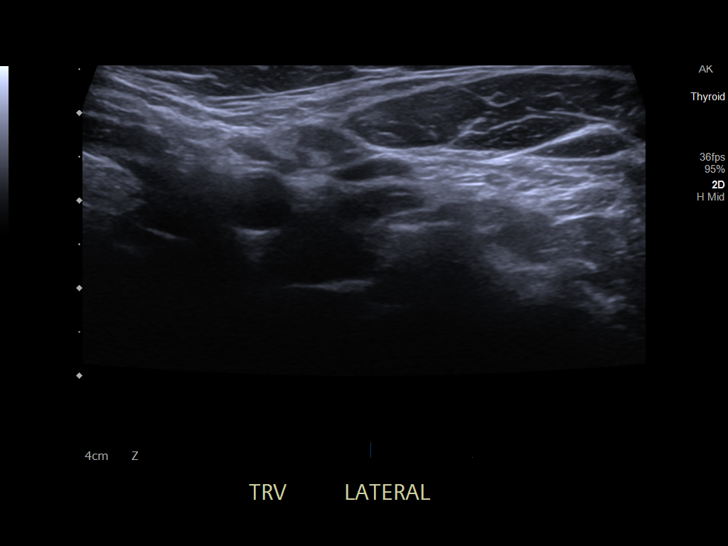
[im 14/22]
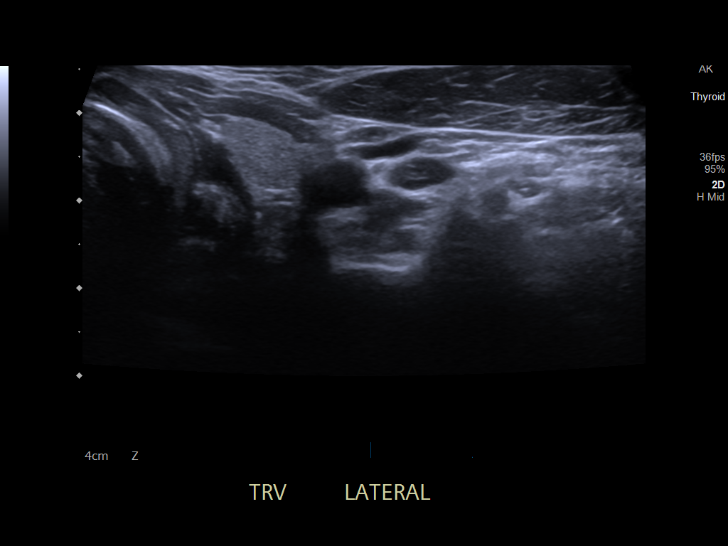
[im 15/22]
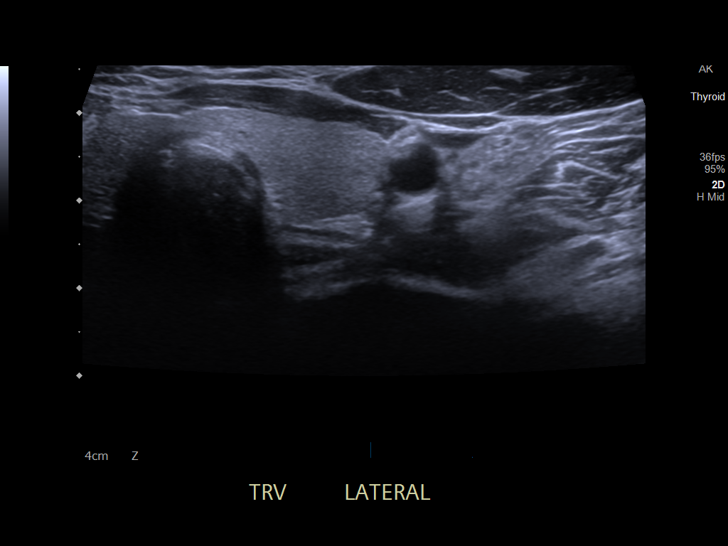
[im 17/22]
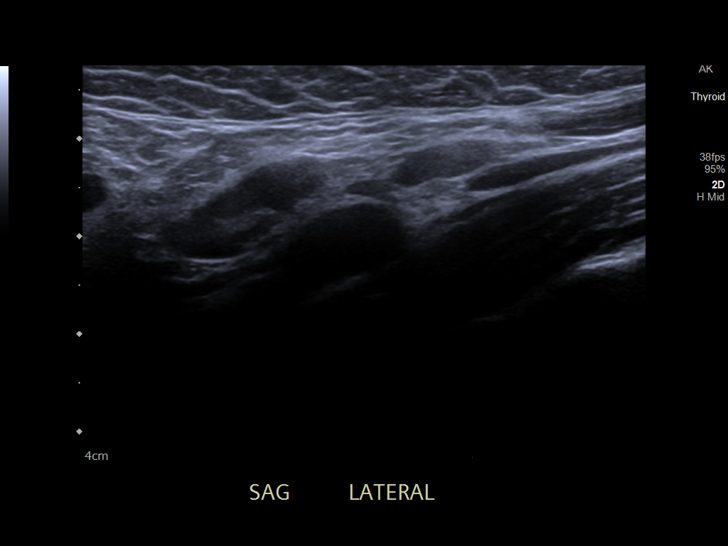
[im 19/22]
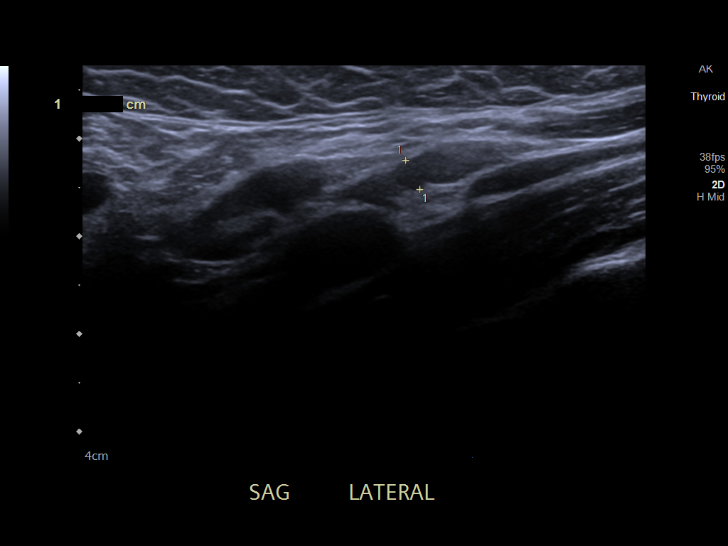
[im 20/22]
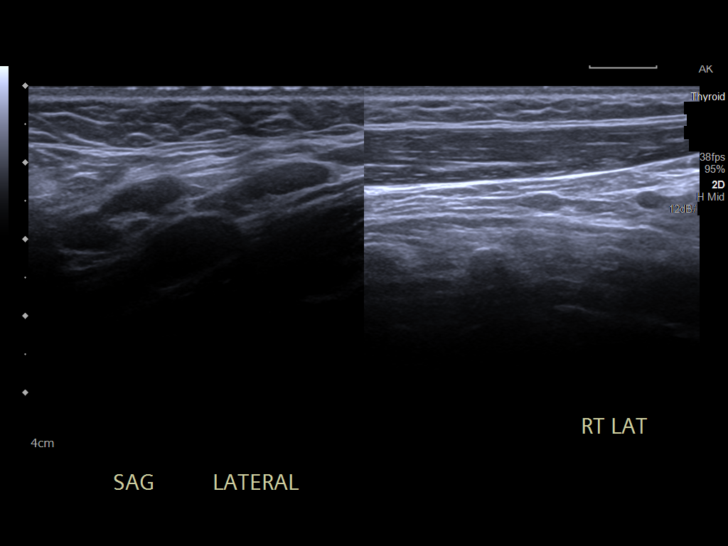
[im 22/22]
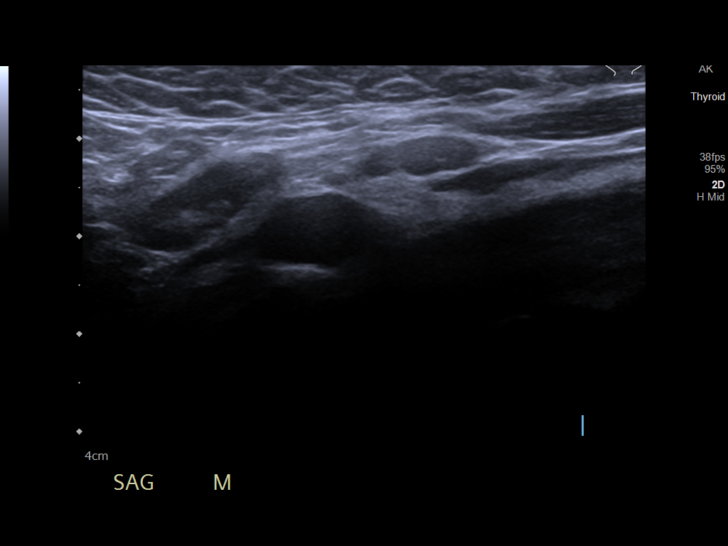

[14 of 22 positions shown; findings below may reference images not displayed]

FINDINGS: Focused ultrasound at the area of palpable concern, along the LEFT
neck.

Images demonstrating well-circumscribed ovoid subfascial hypoechoic
masses consistent with cervical lymph nodes, largest measuring up to
0.7 cm in greatest transaxial dimension.

Imaged portions of thyroid are normal. No additional mass or
abnormal fluid collection within the imaged neck.
IMPRESSION: Palpable abnormality at the LEFT neck consistent with non enlarged
cervical lymph nodes.

No additional mass or abnormal fluid collection within the imaged
neck.

## 2023-01-04 DIAGNOSIS — R030 Elevated blood-pressure reading, without diagnosis of hypertension: Secondary | ICD-10-CM | POA: Insufficient documentation

## 2023-02-04 ENCOUNTER — Ambulatory Visit
Admission: EM | Admit: 2023-02-04 | Discharge: 2023-02-04 | Disposition: A | Discharge status: Home / Self Care | Payer: 59 | Attending: Emergency Medicine | Admitting: Emergency Medicine

## 2023-02-04 ENCOUNTER — Encounter: Payer: Self-pay | Admitting: Emergency Medicine

## 2023-02-04 DIAGNOSIS — S61211A Laceration without foreign body of left index finger without damage to nail, initial encounter: Secondary | ICD-10-CM

## 2023-02-04 NOTE — ED Provider Notes (Signed)
[facility]    CSN: [id number] Arrival date & time: 02/04/23  1339      History   Chief Complaint Chief Complaint  Patient presents with   Extremity Laceration    HPI Wendy Hunt is a 37 y.o. female.   Patient presents for evaluation of a laceration to the left index finger beginning within the hour.  Has been going home projects and was attempting to change the doorknob when it cut her finger.  Has wrapped in gauze, bleeding subsided.  Has full range of motion of the finger.  Denies numbness or tingling.  Past Medical History:  Diagnosis Date   Anemia    Palpitations    RAD (reactive airway disease) with wheezing    Tachycardia     Patient Active Problem List   Diagnosis Date Noted   Pelvic pain in female 11/12/2022   Amenorrhea 06/28/2022   Constipation 06/11/2020   Grade I internal hemorrhoids 06/11/2020   Personal history of COVID-19 05/26/2020   Inappropriate sinus tachycardia 06/04/2019   Anemia, iron deficiency 07/17/2015   Excess, menstruation 07/17/2015   RAD (reactive airway disease) 07/17/2015   Hemorrhoids, external without complications [id number]   Airway hyperreactivity 07/17/2015    Past Surgical History:  Procedure Laterality Date   CESAREAN SECTION  November 2013   HYSTEROSCOPY WITH D & C  12/09/2019   Procedure: DILATATION AND CURETTAGE /HYSTEROSCOPY;  Surgeon: Schermerhorn, Gwen Her, MD;  Location: [facility] ORS;  Service: Gynecology;;   WISDOM TOOTH EXTRACTION  2003    OB History     Gravida  1   Para  1   Term      Preterm      AB      Living         SAB      IAB      Ectopic      Multiple      Live Births               Home Medications    Prior to Admission medications   Medication Sig Start Date End Date Taking? Authorizing Provider  Multiple Vitamins-Minerals ([place]) CHEW    Yes [provider]  albuterol (VENTOLIN HFA) 108 (90 Base) MCG/ACT inhaler INHALE 1-2 PUFFS BY  MOUTH EVERY 6 HOURS AS NEEDED FOR WHEEZE OR SHORTNESS OF BREATH 03/22/22   Bacigalupo, Dionne Bucy, MD  Elderberry 575 MG/5ML SYRP     [provider]  Ferrous Sulfate (IRON PO) Take 1 tablet by mouth daily. 07/14/22 07/14/23  [provider]  ferrous sulfate 325 (65 FE) MG tablet Take 325 mg by mouth daily with breakfast.    [provider]  fluticasone (FLONASE) 50 MCG/ACT nasal spray Place into both nostrils daily.    [provider]  FOLIC ACID PO Take by mouth.    [provider]  phentermine (ADIPEX-P) 37.5 MG tablet Take 1 tablet (37.5 mg total) by mouth daily before breakfast. Patient taking differently: Take 37.5 mg by mouth daily before breakfast. 05/22/20   Burnette, Clearnce Sorrel, PA-C  triamterene-hydrochlorothiazide (MAXZIDE-25) 37.5-25 MG tablet Take 1 tablet by mouth daily. 03/22/21   Mar Daring, PA-C  vitamin C (ASCORBIC ACID) 500 MG tablet Take 500 mg by mouth daily.    [provider]    Family History Family History  Problem Relation Age of Onset   Healthy Mother    Hyperlipidemia Mother    Healthy Father  Pre-DM and hypochondriac   Diabetes Maternal Grandfather    Diabetes Paternal Grandmother    Breast cancer Paternal Grandmother    Esophageal cancer Paternal Grandfather    Diabetes Other    Glaucoma Other    Breast cancer Other    Angina Maternal Grandmother     Social History Social History   Tobacco Use   Smoking status: Former    Packs/day: 0.50    Years: 8.00    Total pack years: 4.00    Types: Cigarettes    Quit date: 12/19/2011    Years since quitting: 11.1   Smokeless tobacco: Never  Vaping Use   Vaping Use: Never used  Substance Use Topics   Alcohol use: Yes    Comment: SOCIAL   Drug use: No     Allergies   Lidocaine-epinephrine and Sulfa antibiotics   Review of Systems Review of Systems  Constitutional: Negative.   Respiratory: Negative.    Cardiovascular: Negative.    Skin:  Positive for wound. Negative for color change, pallor and rash.  Neurological: Negative.      Physical Exam Triage Vital Signs ED Triage Vitals  Enc Vitals Group     BP 02/04/23 1349 129/81     Pulse Rate 02/04/23 1349 84     Resp 02/04/23 1349 14     Temp 02/04/23 1349 98.5 F (36.9 C)     Temp Source 02/04/23 1349 Oral     SpO2 02/04/23 1349 99 %     Weight 02/04/23 1347 138 lb 0.1 oz (62.6 kg)     Height 02/04/23 1347 4' 11"$  (1.499 m)     Head Circumference --      Peak Flow --      Pain Score 02/04/23 1347 2     Pain Loc --      Pain Edu? --      Excl. in [place]? --    No data found.  Updated Vital Signs BP 129/81 (BP Location: Left Arm)   Pulse 84   Temp 98.5 F (36.9 C) (Oral)   Resp 14   Ht 4' 11"$  (1.499 m)   Wt 138 lb 0.1 oz (62.6 kg)   LMP 01/07/2023 (Approximate)   SpO2 99%   BMI 27.87 kg/m   Visual Acuity Right Eye Distance:   Left Eye Distance:   Bilateral Distance:    Right Eye Near:   Left Eye Near:    Bilateral Near:     Physical Exam Constitutional:      Appearance: Normal appearance.  Eyes:     Extraocular Movements: Extraocular movements intact.  Pulmonary:     Effort: Pulmonary effort is normal.  Skin:    Comments: 1 x 1 cm avulsion laceration present to the palmar aspect of the distal phalanx of the left index finger without involvement of the joint or nail, bleeding has subsided, sensation intact, capillary refill less than 3, has full range of motion of the fingers  Neurological:     Mental Status: She is alert and oriented to person, place, and time.      UC Treatments / Results  Labs (all labs ordered are listed, but only abnormal results are displayed) Labs Reviewed - No data to display  EKG   Radiology No results found.  Procedures Laceration Repair  Date/Time: 02/04/2023 2:36 PM  Performed by: Hans Eden, NP Authorized by: Hans Eden, NP   Consent:    Consent obtained:  Verbal   Consent  given by:  Patient and parent   Risks discussed:  Infection and pain   Alternatives discussed:  No treatment Universal protocol:    Patient identity confirmed:  Verbally with patient Anesthesia:    Anesthesia method:  None Laceration details:    Location:  Finger   Finger location:  L index finger   Length (cm):  1 Pre-procedure details:    Preparation:  Patient was prepped and draped in usual sterile fashion Treatment:    Area cleansed with:  Chlorhexidine   Amount of cleaning:  Standard   Debridement:  None Skin repair:    Repair method:  Tissue adhesive Approximation:    Approximation:  Close Repair type:    Repair type:  Simple Post-procedure details:    Dressing:  Non-adherent dressing   Procedure completion:  Tolerated  (including critical care time)  Medications Ordered in UC Medications - No data to display  Initial Impression / Assessment and Plan / UC Course  I have reviewed the triage vital signs and the nursing notes.  Pertinent labs & imaging results that were available during my care of the patient were reviewed by me and considered in my medical decision making (see chart for details).  Laceration of left index finger without foreign body without damage to nail, initial encounter  Able to adhere skin adhesive, successful, cleansed in office, advised daily cleansing with soap and water, pat dry and cover with a nonadherent dressing until healed.,  Given signs for infection to return for reevaluation Final Clinical Impressions(s) / UC Diagnoses   Final diagnoses:  Laceration of left index finger without foreign body without damage to nail, initial encounter     Discharge Instructions      You have placement of your laceration and is able to be closed with skin adhesive, this will naturally fall off with time, do not peel off  Wound has been cleansed here in the office, once home you may cleanse daily with diluted soapy water, pat dry and cover with a  nonstick bandage to keep from becoming dirty  You may apply topical Neosporin or triple antibiotic ointment once a day  over the affected area to help prevent infection until healed  At any point if you begin to see increased swelling, increased pain, any redness, puslike drainage or begin to have fevers please follow-up for reevaluation as these are signs of infection  You are up-to-date on your tetanus and therefore will not need 1 today   ED Prescriptions   None    PDMP not reviewed this encounter.   Hans Eden, NP 02/04/23 1437

## 2023-02-04 NOTE — Discharge Instructions (Signed)
You have placement of your laceration and is able to be closed with skin adhesive, this will naturally fall off with time, do not peel off  Wound has been cleansed here in the office, once home you may cleanse daily with diluted soapy water, pat dry and cover with a nonstick bandage to keep from becoming dirty  You may apply topical Neosporin or triple antibiotic ointment once a day  over the affected area to help prevent infection until healed  At any point if you begin to see increased swelling, increased pain, any redness, puslike drainage or begin to have fevers please follow-up for reevaluation as these are signs of infection  You are up-to-date on your tetanus and therefore will not need 1 today

## 2023-02-04 NOTE — ED Triage Notes (Signed)
Patient states that she cut her left index finger on a door knob around 1 pm today.

## 2023-04-19 DIAGNOSIS — Z3490 Encounter for supervision of normal pregnancy, unspecified, unspecified trimester: Secondary | ICD-10-CM | POA: Insufficient documentation

## 2023-04-26 LAB — OB RESULTS CONSOLE HIV ANTIBODY (ROUTINE TESTING): HIV: NONREACTIVE

## 2023-04-26 LAB — OB RESULTS CONSOLE RUBELLA ANTIBODY, IGM: Rubella: NON-IMMUNE/NOT IMMUNE

## 2023-04-26 LAB — HEPATITIS C ANTIBODY: HCV Ab: NEGATIVE

## 2023-04-26 LAB — OB RESULTS CONSOLE HEPATITIS B SURFACE ANTIGEN: Hepatitis B Surface Ag: NEGATIVE

## 2023-04-26 LAB — OB RESULTS CONSOLE VARICELLA ZOSTER ANTIBODY, IGG: Varicella: IMMUNE

## 2023-04-26 LAB — OB RESULTS CONSOLE RPR: RPR: NONREACTIVE

## 2023-07-24 ENCOUNTER — Encounter: Payer: Self-pay | Admitting: Obstetrics and Gynecology

## 2023-07-24 ENCOUNTER — Observation Stay
Admission: EM | Admit: 2023-07-24 | Discharge: 2023-07-24 | Disposition: A | Discharge status: Home / Self Care | Payer: 59 | Attending: Obstetrics and Gynecology | Admitting: Obstetrics and Gynecology

## 2023-07-24 DIAGNOSIS — O368120 Decreased fetal movements, second trimester, not applicable or unspecified: Principal | ICD-10-CM | POA: Diagnosis present

## 2023-07-24 DIAGNOSIS — Z3A23 23 weeks gestation of pregnancy: Secondary | ICD-10-CM | POA: Diagnosis not present

## 2023-07-24 DIAGNOSIS — Z87891 Personal history of nicotine dependence: Secondary | ICD-10-CM | POA: Diagnosis not present

## 2023-07-24 NOTE — Progress Notes (Signed)
Pt feels much fetal movement

## 2023-07-24 NOTE — Discharge Summary (Signed)
Wendy Hunt is a 37 y.o. female. She is at 110w5d gestation. Patient's last menstrual period was 01/07/2023 (approximate). 11/15/2023, by Other Basis   Prenatal care site: [facility]  Chief complaint: decreased fetal movement   Admission Diagnoses:  1) intrauterine pregnancy at [redacted]w[redacted]d  2) Decreased fetal movement affecting management of pregnancy in second trimester [O36.8120]  Discharge Diagnoses:  Principal Problem:   Decreased fetal movement affecting management of pregnancy in second trimester  Reassuring fetal status   HPI: Wendy Hunt presents to L&D with complaints of decreased fetal movement. She usually feels regular movement throughout the day.  States that she has felt movement since 13 weeks and more regular movement since 16 weeks. Movement today was decreased with only a couple of kicks felt throughout the day.  She tried drinking and eating a piece of cake.  Her pregnancy is complicated by AMA and previous c/section,  .  She denies Contractions, Loss of fluid, or Vaginal bleeding. Endorses fetal movement as decreased .  S: Resting comfortably. no CTX, no VB.no LOF,  Active fetal movement since monitors were placed on abdomen   Maternal Medical History:  Past Medical Hx:  has a past medical history of Anemia, Palpitations, RAD (reactive airway disease) with wheezing, and Tachycardia.    Past Surgical Hx:  has a past surgical history that includes Wisdom tooth extraction (2003); Cesarean section (November 2013); and Hysteroscopy with D & C (12/09/2019).   Allergies  Allergen Reactions   Lidocaine-Epinephrine Rash    Skin irritation    Sulfa Antibiotics Rash     Prior to Admission medications   Medication Sig Start Date End Date Taking? Authorizing Provider  albuterol (VENTOLIN HFA) 108 (90 Base) MCG/ACT inhaler INHALE 1-2 PUFFS BY MOUTH EVERY 6 HOURS AS NEEDED FOR WHEEZE OR SHORTNESS OF BREATH 03/22/22   Bacigalupo, Marzella Schlein, MD  Elderberry 575 MG/5ML SYRP      [provider]  Ferrous Sulfate (IRON PO) Take 1 tablet by mouth daily. 07/14/22 07/14/23  [provider]  ferrous sulfate 325 (65 FE) MG tablet Take 325 mg by mouth daily with breakfast.    [provider]  fluticasone (FLONASE) 50 MCG/ACT nasal spray Place into both nostrils daily.    [provider]  FOLIC ACID PO Take by mouth.    [provider]  Multiple Vitamins-Minerals (MULTI ADULT GUMMIES) CHEW     [provider]  phentermine (ADIPEX-P) 37.5 MG tablet Take 1 tablet (37.5 mg total) by mouth daily before breakfast. Patient taking differently: Take 37.5 mg by mouth daily before breakfast. 05/22/20   Burnette, Alessandra Bevels, PA-C  triamterene-hydrochlorothiazide (MAXZIDE-25) 37.5-25 MG tablet Take 1 tablet by mouth daily. 03/22/21   Margaretann Loveless, PA-C  vitamin C (ASCORBIC ACID) 500 MG tablet Take 500 mg by mouth daily.    [provider]    Social History: She  reports that she quit smoking about 11 years ago. Her smoking use included cigarettes. She started smoking about 19 years ago. She has a 4 pack-year smoking history. She has never used smokeless tobacco. She reports current alcohol use. She reports that she does not use drugs.  Family History: family history includes Angina in her maternal grandmother; Breast cancer in her paternal grandmother and another family member; Diabetes in her maternal grandfather, paternal grandmother, and another family member; Esophageal cancer in her paternal grandfather; Glaucoma in an other family member; Healthy in her father and mother; Hyperlipidemia in her mother.  Review of Systems: A full review of systems was performed and negative except as noted in the HPI.     Pertinent Results:  O:  BP 109/65   Pulse (!) 103   Temp 98.3 F (36.8 C) (Oral)   Resp 16   LMP 01/07/2023 (Approximate)  No results found for this or any previous visit (from the past 48 hour(s)).    Constitutional: NAD, AAOx3  PULM: nl respiratory effort Abd: gravid, non-tender, non-distended, soft  Ext: Non-tender, Nonedmeatous Psych: mood appropriate, speech normal Pelvic : deferred  Fetal monitoring: Baseline FHR: 145 beats/min Decelerations: absent Tocometry: None  Time: at least 20 minutes   Interpretation: Category I - fetal monitoring appropriate for gestational age  INDICATIONS: decreased fetal movement  Consults: None  Hospital Course: The patient was admitted to Labor and Delivery Triage for observation. FHR was monitored for 10 minutes.  No decelerations were noted during monitoring.  [name] reported active fetal movement after the monitors were applied.  Normal fetal movements for 2nd trimester were reviewed with patient and partner.  Warning signs to report to provider and when to go to L&D.  She was deemed stable for discharge and outpatient follow up.    Plan: 1) Fetal monitoring   -Category 1 tracing, appropriate for gestational age  -Reassuring fetal status   Discharge Condition: stable  Disposition: Discharge disposition: 01-Home or Self Care        Allergies as of 07/24/2023       Reactions   Lidocaine-epinephrine Rash   Skin irritation    Sulfa Antibiotics Rash        Medication List     STOP taking these medications    phentermine 37.5 MG tablet Commonly known as: ADIPEX-P   triamterene-hydrochlorothiazide 37.5-25 MG tablet Commonly known as: MAXZIDE-25       TAKE these medications    albuterol 108 (90 Base) MCG/ACT inhaler Commonly known as: VENTOLIN HFA INHALE 1-2 PUFFS BY MOUTH EVERY 6 HOURS AS NEEDED FOR WHEEZE OR SHORTNESS OF BREATH   ascorbic acid 500 MG tablet Commonly known as: VITAMIN C Take 500 mg by mouth daily.   Elderberry 575 MG/5ML Syrp   ferrous sulfate 325 (65 FE) MG tablet Take 325 mg by mouth daily with breakfast.   fluticasone 50 MCG/ACT nasal spray Commonly known as: FLONASE Place into both  nostrils daily.   FOLIC ACID PO Take by mouth.   IRON PO Take 1 tablet by mouth daily.   Multi Adult Gummies Chew        Follow-up Information     [facility]. Go in 1 week(s).   Why: routine prenatal appointment Contact information: [address] [place] [address] [phone]               ----- Margaretmary Eddy, CNM Certified Nurse Midwife [facility]

## 2023-07-24 NOTE — OB Triage Note (Signed)
Pt presents wth c/o decreased fetal movement. FHR 145 +FM observed

## 2023-07-24 NOTE — Progress Notes (Signed)
OBIX not connecting to centralized monitoring, paper tracing running

## 2023-09-27 ENCOUNTER — Other Ambulatory Visit: Payer: Self-pay

## 2023-09-27 ENCOUNTER — Observation Stay
Admission: EM | Admit: 2023-09-27 | Discharge: 2023-09-27 | Disposition: A | Discharge status: Home / Self Care | Payer: 59 | Attending: Obstetrics and Gynecology | Admitting: Obstetrics and Gynecology

## 2023-09-27 ENCOUNTER — Encounter: Payer: Self-pay | Admitting: Obstetrics and Gynecology

## 2023-09-27 DIAGNOSIS — O99513 Diseases of the respiratory system complicating pregnancy, third trimester: Secondary | ICD-10-CM | POA: Insufficient documentation

## 2023-09-27 DIAGNOSIS — J45909 Unspecified asthma, uncomplicated: Secondary | ICD-10-CM | POA: Diagnosis not present

## 2023-09-27 DIAGNOSIS — O368130 Decreased fetal movements, third trimester, not applicable or unspecified: Principal | ICD-10-CM | POA: Insufficient documentation

## 2023-09-27 DIAGNOSIS — Z98891 History of uterine scar from previous surgery: Secondary | ICD-10-CM | POA: Diagnosis not present

## 2023-09-27 DIAGNOSIS — O368190 Decreased fetal movements, unspecified trimester, not applicable or unspecified: Principal | ICD-10-CM | POA: Diagnosis present

## 2023-09-27 DIAGNOSIS — O09523 Supervision of elderly multigravida, third trimester: Secondary | ICD-10-CM | POA: Diagnosis not present

## 2023-09-27 DIAGNOSIS — Z3A33 33 weeks gestation of pregnancy: Secondary | ICD-10-CM | POA: Diagnosis not present

## 2023-09-27 NOTE — Progress Notes (Signed)
Discharge instructions provided to patient. Patient verbalized understanding. Pt educated on signs and symptoms of labor, vaginal bleeding, LOF, and fetal movement. Red flag signs reviewed by RN. Patient discharged home with family member in stable condition. 

## 2023-09-27 NOTE — Discharge Summary (Signed)
Patient ID: JALAYAH GUTRIDGE MRN: [id number] DOB/AGE: [birth year] 38 y.o.  Admit date: 09/27/2023 Discharge date: 09/27/2023  Admission Diagnoses: 37yo G3P1 at [redacted]w[redacted]d present with decreased fetal movement.  Denies cramping, contractions, or LOF.  After arrival she reports feeling her baby move a lot.  Discharge Diagnoses: RNST  Factors complicating pregnancy: Uterine-size dates discrepancy AMA History of cesarean section Tachycardia Reactive airway disease Carpal Tunnel Syndrome History of gHTN   Prenatal Procedures: NST  Consults: None   Significant Diagnostic Studies:  No results found for this or any previous visit (from the past 168 hour(s)).  Treatments: none  Hospital Course:  NST was reactive.  She was observed, fetal heart rate monitoring remained reassuring, and she had no signs/symptoms of labor or other maternal-fetal concerns.   She was deemed stable for discharge to home with outpatient follow up.  Discharge Physical Exam:  BP 123/83 (BP Location: Left Arm)   Pulse (!) 109   Temp 98.2 F (36.8 C) (Oral)   Resp 18   Ht 4\' 11"  (1.499 m)   Wt 80.7 kg   LMP 01/07/2023 (Approximate)   BMI 35.95 kg/m    NST: FHR baseline: 130 bpm Variability: moderate Accelerations: yes Decelerations: none Category/reactivity: reactive  TOCO: quiet SVE: deferred      Discharge Condition: Stable  Disposition:  Discharge disposition: 01-Home or Self Care        Allergies as of 09/27/2023       Reactions   Lidocaine-epinephrine Rash   Skin irritation    Sulfa Antibiotics Rash        Medication List     TAKE these medications    albuterol 108 (90 Base) MCG/ACT inhaler Commonly known as: VENTOLIN HFA INHALE 1-2 PUFFS BY MOUTH EVERY 6 HOURS AS NEEDED FOR WHEEZE OR SHORTNESS OF BREATH   ascorbic acid 500 MG tablet Commonly known as: VITAMIN C Take 500 mg by mouth daily.   Elderberry 575 MG/5ML Syrp   ferrous sulfate 325 (65 FE) MG tablet Take  325 mg by mouth daily with breakfast.   fluticasone 50 MCG/ACT nasal spray Commonly known as: FLONASE Place into both nostrils daily.   FOLIC ACID PO Take by mouth.   IRON PO Take 1 tablet by mouth daily.   Multi Adult Gummies Chew         SignedHaroldine Laws, CNM 09/27/2023 10:17 AM

## 2023-09-27 NOTE — OB Triage Note (Signed)
Patient is a 37 yo, G3P1, at 33 weeks 0 days. Patient presents with complaints of decreased fetal movement. Patient states she had not felt her baby move since around 0600 today but now that she is here at the hospital she is feeling fetal movement. Patient reassured that it is very appropriate to come in for evaluation for decreased fetal movement.  Patient denies any vaginal bleeding or LOF. Patient reports some BH ctx. Monitors applied and assessing. VSS. Initial fetal heart tone 145. [place] CNM notified of patients arrival to unit. Plan to place in observation for NST. [place] en route to review fhr tracing.

## 2023-10-12 ENCOUNTER — Observation Stay
Admission: EM | Admit: 2023-10-12 | Discharge: 2023-10-12 | Disposition: A | Discharge status: Home / Self Care | Payer: 59 | Attending: Certified Nurse Midwife | Admitting: Certified Nurse Midwife

## 2023-10-12 ENCOUNTER — Encounter: Payer: Self-pay | Admitting: Obstetrics and Gynecology

## 2023-10-12 ENCOUNTER — Other Ambulatory Visit: Payer: Self-pay

## 2023-10-12 DIAGNOSIS — O2213 Genital varices in pregnancy, third trimester: Principal | ICD-10-CM | POA: Insufficient documentation

## 2023-10-12 DIAGNOSIS — Z87891 Personal history of nicotine dependence: Secondary | ICD-10-CM | POA: Insufficient documentation

## 2023-10-12 DIAGNOSIS — Z98891 History of uterine scar from previous surgery: Secondary | ICD-10-CM | POA: Diagnosis not present

## 2023-10-12 DIAGNOSIS — Z3A35 35 weeks gestation of pregnancy: Secondary | ICD-10-CM | POA: Insufficient documentation

## 2023-10-12 DIAGNOSIS — O23593 Infection of other part of genital tract in pregnancy, third trimester: Secondary | ICD-10-CM | POA: Diagnosis not present

## 2023-10-12 DIAGNOSIS — Z79899 Other long term (current) drug therapy: Secondary | ICD-10-CM | POA: Insufficient documentation

## 2023-10-12 DIAGNOSIS — O09523 Supervision of elderly multigravida, third trimester: Secondary | ICD-10-CM | POA: Diagnosis not present

## 2023-10-12 DIAGNOSIS — O4690 Antepartum hemorrhage, unspecified, unspecified trimester: Principal | ICD-10-CM | POA: Diagnosis present

## 2023-10-12 DIAGNOSIS — O4693 Antepartum hemorrhage, unspecified, third trimester: Secondary | ICD-10-CM | POA: Diagnosis present

## 2023-10-12 DIAGNOSIS — B9689 Other specified bacterial agents as the cause of diseases classified elsewhere: Secondary | ICD-10-CM | POA: Diagnosis not present

## 2023-10-12 LAB — URINALYSIS, COMPLETE (UACMP) WITH MICROSCOPIC
Bilirubin Urine: NEGATIVE
Glucose, UA: NEGATIVE mg/dL
Ketones, ur: 5 mg/dL — AB
Leukocytes,Ua: NEGATIVE
Nitrite: NEGATIVE
Protein, ur: NEGATIVE mg/dL
Specific Gravity, Urine: 1.009 (ref 1.005–1.030)
pH: 7 (ref 5.0–8.0)

## 2023-10-12 LAB — CHLAMYDIA/NGC RT PCR (ARMC ONLY)
Chlamydia Tr: NOT DETECTED
N gonorrhoeae: NOT DETECTED

## 2023-10-12 LAB — WET PREP, GENITAL
Sperm: NONE SEEN
Trich, Wet Prep: NONE SEEN
WBC, Wet Prep HPF POC: >=10 — AB (ref <10)
Yeast Wet Prep HPF POC: NONE SEEN

## 2023-10-12 MED ORDER — METRONIDAZOLE 500 MG PO TABS
500.0000 mg | ORAL_TABLET | Freq: Two times a day (BID) | ORAL | Status: DC
  Administered 2023-10-12: 500 mg via ORAL
  Filled 2023-10-12: qty 1

## 2023-10-12 MED ORDER — PRENATAL MULTIVITAMIN CH: 1.0000 | ORAL_TABLET | Freq: Every day | ORAL | Status: DC

## 2023-10-12 MED ORDER — ZOLPIDEM TARTRATE 5 MG PO TABS: 5.0000 mg | ORAL_TABLET | Freq: Every evening | ORAL | Status: DC | PRN

## 2023-10-12 MED ORDER — ACETAMINOPHEN 325 MG PO TABS: 650.0000 mg | ORAL_TABLET | ORAL | Status: DC | PRN

## 2023-10-12 MED ORDER — CALCIUM CARBONATE ANTACID 500 MG PO CHEW: 2.0000 | CHEWABLE_TABLET | ORAL | Status: DC | PRN

## 2023-10-12 MED ORDER — METRONIDAZOLE 500 MG PO TABS: 500.0000 mg | ORAL_TABLET | Freq: Two times a day (BID) | ORAL | 0 refills | Status: AC

## 2023-10-12 MED ORDER — DOCUSATE SODIUM 100 MG PO CAPS: 100.0000 mg | ORAL_CAPSULE | Freq: Every day | ORAL | Status: DC

## 2023-10-12 NOTE — Discharge Summary (Signed)
Wendy Hunt is a 37 y.o. female. She is at [redacted]w[redacted]d gestation. Patient's last menstrual period was 01/07/2023 (approximate). Estimated Date of Delivery: 11/15/23  Prenatal care site: [facility]    Current pregnancy complicated by:  Linward Foster dates discrepancy AMA History of cesarean section Tachycardia Reactive airway disease Carpal Tunnel Syndrome History of gHTN  Chief complaint: vaginal bleeding  She reports tonight she got up to use the bathroom and noticed bright red blood on her underwear and when she wiped. The bleeding continued on the underwear and when wiping. She denies any contractions, vaginal discharge, or UTI symptoms. She endorses good fetal movement.  S: Resting comfortably. no CTX, no LOF,  Active fetal movement.   Denies: HA, visual changes, SOB, or RUQ/epigastric pain  Maternal Medical History:   Past Medical History:  Diagnosis Date   Anemia    Palpitations    RAD (reactive airway disease) with wheezing    Tachycardia     Past Surgical History:  Procedure Laterality Date   CESAREAN SECTION  November 2013   HYSTEROSCOPY WITH D & C  12/09/2019   Procedure: DILATATION AND CURETTAGE /HYSTEROSCOPY;  Surgeon: Schermerhorn, Ihor Austin, MD;  Location: [facility] ORS;  Service: Gynecology;;   WISDOM TOOTH EXTRACTION  2003    Allergies  Allergen Reactions   Lidocaine-Epinephrine (Pf) Rash    Skin irritation    Sulfa Antibiotics Rash    Prior to Admission medications   Medication Sig Start Date End Date Taking? Authorizing Provider  albuterol (VENTOLIN HFA) 108 (90 Base) MCG/ACT inhaler INHALE 1-2 PUFFS BY MOUTH EVERY 6 HOURS AS NEEDED FOR WHEEZE OR SHORTNESS OF BREATH 03/22/22  Yes Bacigalupo, Marzella Schlein, MD  Multiple Vitamins-Minerals (MULTI ADULT GUMMIES) CHEW    Yes [provider]  Lucila Maine 575 MG/5ML SYRP     [provider]  Ferrous Sulfate (IRON PO) Take 1 tablet by mouth daily. 07/14/22 07/14/23  [provider]   ferrous sulfate 325 (65 FE) MG tablet Take 325 mg by mouth daily with breakfast. Patient not taking: Reported on 10/12/2023    [provider]  fluticasone (FLONASE) 50 MCG/ACT nasal spray Place into both nostrils daily. Patient not taking: Reported on 10/12/2023    [provider]  FOLIC ACID PO Take by mouth. Patient not taking: Reported on 10/12/2023    [provider]  metroNIDAZOLE (FLAGYL) 500 MG tablet Take 1 tablet (500 mg total) by mouth 2 (two) times daily for 7 days. 10/12/23 10/19/23  [name], CNM  vitamin C (ASCORBIC ACID) 500 MG tablet Take 500 mg by mouth daily. Patient not taking: Reported on 10/12/2023    [provider]    Social History: She  reports that she quit smoking about 11 years ago. Her smoking use included cigarettes. She started smoking about 19 years ago. She has a 4 pack-year smoking history. She has never used smokeless tobacco. She reports current alcohol use. She reports that she does not use drugs.  Family History: family history includes Angina in her maternal grandmother; Breast cancer in her paternal grandmother and another family member; Diabetes in her maternal grandfather, paternal grandmother, and another family member; Esophageal cancer in her paternal grandfather; Glaucoma in an other family member; Healthy in her father and mother; Hyperlipidemia in her mother.  no history of gyn cancers  Review of Systems: A full review of systems was performed and negative except as noted in the HPI.     O:  Temp 98.4  F (36.9 C) (Oral)   Ht 4\' 11"  (1.499 m)   Wt 81.6 kg   LMP 01/07/2023 (Approximate)   BMI 36.36 kg/m   Vitals:   10/12/23 0125 10/12/23 0140 10/12/23 0155  BP: 122/80 129/73 123/80    Results for orders placed or performed during the hospital encounter of 10/12/23 (from the past 48 hour(s))  Wet prep, genital   Collection Time: 10/12/23  1:14 AM  Result Value Ref Range   Yeast Wet  Prep HPF POC NONE SEEN NONE SEEN   Trich, Wet Prep NONE SEEN NONE SEEN   Clue Cells Wet Prep HPF POC PRESENT (A) NONE SEEN   WBC, Wet Prep HPF POC >=10 (A) <10   Sperm NONE SEEN   Urinalysis, Complete w Microscopic -Urine, Clean Catch   Collection Time: 10/12/23  1:14 AM  Result Value Ref Range   Color, Urine YELLOW (A) YELLOW   APPearance CLEAR (A) CLEAR   Specific Gravity, Urine 1.009 1.005 - 1.030   pH 7.0 5.0 - 8.0   Glucose, UA NEGATIVE NEGATIVE mg/dL   Hgb urine dipstick MODERATE (A) NEGATIVE   Bilirubin Urine NEGATIVE NEGATIVE   Ketones, ur 5 (A) NEGATIVE mg/dL   Protein, ur NEGATIVE NEGATIVE mg/dL   Nitrite NEGATIVE NEGATIVE   Leukocytes,Ua NEGATIVE NEGATIVE   RBC / HPF 11-20 0 - 5 RBC/hpf   WBC, UA 0-5 0 - 5 WBC/hpf   Bacteria, UA RARE (A) NONE SEEN   Squamous Epithelial / HPF 0-5 0 - 5 /HPF   Mucus PRESENT      Constitutional: NAD, AAOx3  HE/ENT: extraocular movements grossly intact, moist mucous membranes CV: RRR PULM: nl respiratory effort, CTABL     Abd: gravid, non-tender, non-distended, soft      Ext: Non-tender, Nonedematous   Psych: mood appropriate, speech normal Pelvic: SSE done  Pelvic exam: normal external genitalia, vulva, vagina, cervix, uterus and adnexa. Thick vaginal discharge present. No blood present in vagina or coming from cervix, no irritation or redness noted.  - Small varicosity noted to be bleeding on her left labia  Fetal  monitoring: Cat 1 Appropriate for GA Baseline: 140bpm  Variability: moderate Accelerations: present x >2 Decelerations absent Uterine contractions: occasional, rare  A/P: 37 y.o. [redacted]w[redacted]d here for antenatal surveillance for vaginal bleeding  Principle Diagnosis: Vaginal varicosity, BV  Vaginal bleeding: vaginal varicosity present which is bleeding, no bleeding noted in vagina or near cervix. Advised witch hazel pads and ice packs for the varicosity. BV: present, will treat with Metronidazole 500mg  BID x 7  days. Labor: not present.  Fetal Wellbeing: Reassuring Cat 1 tracing. Reactive NST  D/c home stable, precautions reviewed, follow-up as scheduled.    [name], CNM 10/12/2023 2:09 AM

## 2023-10-18 LAB — OB RESULTS CONSOLE GBS
GBS: NEGATIVE
GBS: NEGATIVE

## 2023-10-18 LAB — OB RESULTS CONSOLE GC/CHLAMYDIA
Chlamydia: NEGATIVE
Neisseria Gonorrhea: NEGATIVE

## 2023-10-23 NOTE — H&P (Signed)
Wendy Hunt is a 37 y.o. female presenting for repeat LTCS on 11/08/23 . [facility] 11/15/23  36 y.o. G2P1011 at  Patient's last menstrual period was 02/08/2023 (exact date). consistent with  with ultrasound 03/29/23@[redacted]w[redacted]d  Estimated Date of Delivery:11/15/23 Sex of baby and name:  Boy Wendy Hunt "Wendy Hunt"   Partner:  Wendy Hunt   Factors complicating this pregnancy  Elevated 1hr GTT 08/31/23: 1hr GTT 141,  09/06/23: 3hr GTT passed: 82, 211, 145, 105 Uterine-size dates discrepancy 07/31/23: 24wks, measuring 27cm, growth [place] ordered AMA Age at delivery: 54 Growth Korea @ 36wks, Wendy Hunt Korea tech said she can have her 67 year old son with her for that visit History of cesarean section Desires repeat cesarean section with TJS scheduled 11/08/23 Wendy Hunt wants to assist Tachycardia Has an established relationship with a cardiologist who she sees as needed Reactive airway disease Not asthma Uses albuterol PRN Carpal Tunnel Syndrome Worsening throughout the pregnancy 06/30/23 - referred to ortho for evaluation History of gHTN Start 81mg  aspirin daily at 12wks NOB baseline CMP: Baseline P/C ratio:91 Screening results and needs: NOB:  Medicaid Questionnaire:  []  ACHD Program Depression Score: MBT: A POS  Ab screen:neg    HIV:neg   RPR:NR    Hep B:neg    Hep C:NR  ZOX:WRUEAVW G/C:  Rubella: non-immune   UJW:JXBJYN  TSH:1.094  HgA1C:5.3 Aneuploidy:  First trimester:  MaternitT21:neg   Second trimester (AFP/tetra): Negative 28 weeks:  Review Medicaid Questionnaire: []  ACHD Program n/a Depression Score:1  Blood consent:signed AC 08/30/23 Hgb: 11.1   Platelets: 191   Glucola: 141, 3hr: 82, 211, 145, 105  Rhogam: n/a 36 weeks:  GBS: neg  G/C: neg/neg  Hgb: 11.1 Platelets: 158   HIV:neg RPR:   NR Last [place]:  03/29/23:  Uterus: Anteverted, Single, viable IUP, S= [redacted]w[redacted]d Yolk sac and amnion seen FHR= 137bpm Cervical length= 4.0cm Right ovary wnl Left ovary wnl, CLC No free fluid seen  04/26/23: Uterus:  Anteverted Single, viable IUP, S= [redacted]w[redacted]d, Yolk sac and amnion seen, FHR= 165bpm, Cervical length= 4.8cm, No free fluid seen 06/30/23: IUP measuring [redacted]w[redacted]d, FHR=146bpm, Cervical length = 5.87cm, Presentation= transverse, head to left, Placenta= posterior, Normal anatomy US 08/30/23: transverse, post plac, AFI-18.3 cm, EFW-1534 g @80 %, cerv length- 5.76 cm 10/04/23: breech, post plac, EFW-2616 g @ 78%, AFI-20.8 cm Immunization:   Flu in season - given [facility] 08/30/23 Tdap at 27-36 weeks -given [facility] 08/30/23 Covid-19 -  RSV at 32-36 weeks - given 09/20/23 JW Contraception Plan: nothing hormonal, likely nothing Feeding Plan:  Labor Plans:       OB History     Gravida  3   Para  1   Term      Preterm  1   AB  1   Living  1      SAB      IAB      Ectopic      Multiple      Live Births             Past Medical History:  Diagnosis Date   Anemia    Palpitations    RAD (reactive airway disease) with wheezing    Tachycardia    Past Surgical History:  Procedure Laterality Date   CESAREAN SECTION  November 2013   HYSTEROSCOPY WITH D & C  12/09/2019   Procedure: DILATATION AND CURETTAGE /HYSTEROSCOPY;  Surgeon: Wendy Hunt, Wendy Austin, MD;  Location: [facility] ORS;  Service: Gynecology;;   WISDOM TOOTH EXTRACTION  2003  Family History: family history includes Angina in her maternal grandmother; Breast cancer in her paternal grandmother and another family member; Diabetes in her maternal grandfather, paternal grandmother, and another family member; Esophageal cancer in her paternal grandfather; Glaucoma in an other family member; Healthy in her father and mother; Hyperlipidemia in her mother. Social History:  reports that she quit smoking about 11 years ago. Her smoking use included cigarettes. She started smoking about 19 years ago. She has a 4 pack-year smoking history. She has never used smokeless tobacco. She reports current alcohol use. She reports that she does not use  drugs.      Review of Systems History   Last menstrual period 01/07/2023. Exam Physical Exam  BP 125/83  Lungs CTA   CV RRR  Adb : gravid  Prenatal labs: ABO, Rh:  A+ Antibody:  neg  Rubella:  Non immune  RPR:   NR  HBsAg:   neg , Hep C : Neg  HIV:   neg GBS:   neg   Assessment/Plan: Elective repeat LTCS 11/08/23 The risks of cesarean section discussed with the patient included but were not limited to: bleeding which may require transfusion or reoperation; infection which may require antibiotics; injury to bowel, bladder, ureters or other surrounding organs; injury to the fetus; need for additional procedures including hysterectomy in the event of a life-threatening hemorrhage; placental abnormalities wth subsequent pregnancies, incisional problems, thromboembolic phenomenon and other postoperative/anesthesia complications. The patient concurred with the proposed plan, giving informed written consent for the procedure.   . Preoperative prophylactic antibiotics and SCDs ordered on call to the OR.  To OR when ready.    Wendy Hunt 10/23/2023, 11:59 AM

## 2023-11-01 ENCOUNTER — Encounter
Admission: RE | Admit: 2023-11-01 | Discharge: 2023-11-01 | Disposition: A | Discharge status: Home / Self Care | Payer: 59 | Source: Ambulatory Visit | Attending: Obstetrics and Gynecology | Admitting: Obstetrics and Gynecology

## 2023-11-01 ENCOUNTER — Other Ambulatory Visit: Payer: Self-pay

## 2023-11-01 HISTORY — DX: Excessive and frequent menstruation with regular cycle: N92.0

## 2023-11-01 HISTORY — DX: Elevated blood-pressure reading, without diagnosis of hypertension: R03.0

## 2023-11-01 HISTORY — DX: Amenorrhea, unspecified: N91.2

## 2023-11-01 HISTORY — DX: Antepartum hemorrhage, unspecified, unspecified trimester: O46.90

## 2023-11-01 NOTE — Patient Instructions (Addendum)
Your procedure is scheduled on:Wednesday November 20  Report to the Registration Desk on the 1st floor of the [facility] To find out your arrival time, please call [phone] between 1PM - 3PM on:  Tuesday November 19  If your arrival time is 6:00 am, do not arrive before that time as the [facility] entrance doors do not open until 6:00 am.  REMEMBER: Instructions that are not followed completely may result in serious medical risk, up to and including death; or upon the discretion of your surgeon and anesthesiologist your surgery may need to be rescheduled.  Do not eat food after midnight the night before surgery.  No gum chewing or hard candies.   One week prior to surgery: Wednesday November 13  Stop Anti-inflammatories (NSAIDS) such as Advil, Aleve, Ibuprofen, Motrin, Naproxen, Naprosyn and Aspirin based products such as Excedrin, Goody's Powder, BC Powder.  Stop ANY OVER THE COUNTER supplements until after surgery.  You may however, continue to take Tylenol if needed for pain up until the day of surgery.  Continue taking all of your other prescription medications up until the day of surgery.  ON THE DAY OF SURGERY DO NOT TAKE ANY MEDICATIONS.   Use inhalers on the day of surgery and bring to the hospital. albuterol (VENTOLIN HFA)   No Alcohol for 24 hours before or after surgery.  No Smoking including e-cigarettes for 24 hours before surgery.  No chewable tobacco products for at least 6 hours before surgery.  No nicotine patches on the day of surgery.  Do not use any "recreational" drugs for at least a week (preferably 2 weeks) before your surgery.  Please be advised that the combination of cocaine and anesthesia may have negative outcomes, up to and including death. If you test positive for cocaine, your surgery will be cancelled.  On the morning of surgery brush your teeth with toothpaste and water, you may rinse your mouth with mouthwash if you wish. Do not  swallow any toothpaste or mouthwash.  Use CHG wipes as directed on instruction sheet.  Do not wear jewelry, make-up, hairpins, clips or nail polish.  For welded (permanent) jewelry: bracelets, anklets, waist bands, etc.  Please have this removed prior to surgery.  If it is not removed, there is a chance that hospital personnel will need to cut it off on the day of surgery.  Do not wear lotions, powders, or perfumes.   Do not shave body hair from the neck down 48 hours before surgery.  Contact lenses, hearing aids and dentures may not be worn into surgery.  Do not bring valuables to the hospital. [facility] is not responsible for any missing/lost belongings or valuables.   Notify your doctor if there is any change in your medical condition (cold, fever, infection).  Wear comfortable clothing (specific to your surgery type) to the hospital.  After surgery, you can help prevent lung complications by doing breathing exercises.  Take deep breaths and cough every 1-2 hours. Your doctor may order a device called an Incentive Spirometer to help you take deep breaths.  If you are being admitted to the hospital overnight, leave your suitcase in the car. After surgery it may be brought to your room.  In case of increased patient census, it may be necessary for you, the patient, to continue your postoperative care in the Same Day Surgery department.  If you are being discharged the day of surgery, you will not be allowed to drive home. You will  need a responsible individual to drive you home and stay with you for 24 hours after surgery.   If you are taking public transportation, you will need to have a responsible individual with you.  Please call the Pre-admissions Testing Dept. at [phone] if you have any questions about these instructions.  Surgery Visitation Policy:  Patients having surgery or a procedure may have two visitors.  Children under the age of 87 must have an adult with  them who is not the patient.  Inpatient Visitation:    Visiting hours are 7 a.m. to 8 p.m. Up to four visitors are allowed at one time in a patient room. The visitors may rotate out with other people during the day.  One visitor age 11 or older may stay with the patient overnight and must be in the room by 8 p.m.           Preparing the Skin Before Surgery     To help prevent the risk of infection at your surgical site, we are now providing you with rinse-free Sage 2% Chlorhexidine Gluconate (CHG) disposable wipes.  Chlorhexidine Gluconate (CHG) Soap  o An antiseptic cleaner that kills germs and bonds with the skin to continue killing germs even after washing  o Used for showering the night before surgery and morning of surgery  The night before surgery: Shower or bathe with warm water. Do not apply perfume, lotions, powders. Wait one hour after shower. Skin should be dry and cool. Open Sage wipe package - use 6 disposable cloths. Wipe body using one cloth for the right arm, one cloth for the left arm, one cloth for the right leg, one cloth for the left leg, one cloth for the chest/abdomen area, and one cloth for the back. Do not use on open wounds or sores. Do not use on face or genitals (private parts). If you are breast feeding, do not use on breasts. 5. Do not rinse, allow to dry. 6. Skin may feel "tacky" for several minutes. 7. Dress in clean clothes. 8. Place clean sheets on your bed and do not sleep with pets.  REPEAT ABOVE ON THE MORNING OF SURGERY BEFORE ARRIVING TO THE HOSPITAL.Preparing the Skin Before Surgery     To help prevent the risk of infection at your surgical site, we are now providing you with rinse-free Sage 2% Chlorhexidine Gluconate (CHG) disposable wipes.  Chlorhexidine Gluconate (CHG) Soap  o An antiseptic cleaner that kills germs and bonds with the skin to continue killing germs even after washing  o Used for showering the night before  surgery and morning of surgery  The night before surgery: Shower or bathe with warm water. Do not apply perfume, lotions, powders. Wait one hour after shower. Skin should be dry and cool. Open Sage wipe package - use 6 disposable cloths. Wipe body using one cloth for the right arm, one cloth for the left arm, one cloth for the right leg, one cloth for the left leg, one cloth for the chest/abdomen area, and one cloth for the back. Do not use on open wounds or sores. Do not use on face or genitals (private parts). If you are breast feeding, do not use on breasts. 5. Do not rinse, allow to dry. 6. Skin may feel "tacky" for several minutes. 7. Dress in clean clothes. 8. Place clean sheets on your bed and do not sleep with pets.  REPEAT ABOVE ON THE MORNING OF SURGERY BEFORE ARRIVING TO THE HOSPITAL.  How to Use an Incentive Spirometer  An incentive spirometer is a tool that measures how well you are filling your lungs with each breath. Learning to take long, deep breaths using this tool can help you keep your lungs clear and active. This may help to reverse or lessen your chance of developing breathing (pulmonary) problems, especially infection. You may be asked to use a spirometer: After a surgery. If you have a lung problem or a history of smoking. After a long period of time when you have been unable to move or be active. If the spirometer includes an indicator to show the highest number that you have reached, your health care provider or respiratory therapist will help you set a goal. Keep a log of your progress as told by your health care provider. What are the risks? Breathing too quickly may cause dizziness or cause you to pass out. Take your time so you do not get dizzy or light-headed. If you are in pain, you may need to take pain medicine before doing incentive spirometry. It is harder to take a deep breath if you are having pain. How to use your incentive  spirometer  Sit up on the edge of your bed or on a chair. Hold the incentive spirometer so that it is in an upright position. Before you use the spirometer, breathe out normally. Place the mouthpiece in your mouth. Make sure your lips are closed tightly around it. Breathe in slowly and as deeply as you can through your mouth, causing the piston or the ball to rise toward the top of the chamber. Hold your breath for 3-5 seconds, or for as long as possible. If the spirometer includes a coach indicator, use this to guide you in breathing. Slow down your breathing if the indicator goes above the marked areas. Remove the mouthpiece from your mouth and breathe out normally. The piston or ball will return to the bottom of the chamber. Rest for a few seconds, then repeat the steps 10 or more times. Take your time and take a few normal breaths between deep breaths so that you do not get dizzy or light-headed. Do this every 1-2 hours when you are awake. If the spirometer includes a goal marker to show the highest number you have reached (best effort), use this as a goal to work toward during each repetition. After each set of 10 deep breaths, cough a few times. This will help to make sure that your lungs are clear. If you have an incision on your chest or abdomen from surgery, place a pillow or a rolled-up towel firmly against the incision when you cough. This can help to reduce pain while taking deep breaths and coughing. General tips When you are able to get out of bed: Walk around often. Continue to take deep breaths and cough in order to clear your lungs. Keep using the incentive spirometer until your health care provider says it is okay to stop using it. If you have been in the hospital, you may be told to keep using the spirometer at home. Contact a health care provider if: You are having difficulty using the spirometer. You have trouble using the spirometer as often as instructed. Your pain  medicine is not giving enough relief for you to use the spirometer as told. You have a fever. Get help right away if: You develop shortness of breath. You develop a cough with bloody mucus from the lungs. You have fluid or blood coming from  an incision site after you cough. Summary An incentive spirometer is a tool that can help you learn to take long, deep breaths to keep your lungs clear and active. You may be asked to use a spirometer after a surgery, if you have a lung problem or a history of smoking, or if you have been inactive for a long period of time. Use your incentive spirometer as instructed every 1-2 hours while you are awake. If you have an incision on your chest or abdomen, place a pillow or a rolled-up towel firmly against your incision when you cough. This will help to reduce pain. Get help right away if you have shortness of breath, you cough up bloody mucus, or blood comes from your incision when you cough. This information is not intended to replace advice given to you by your health care provider. Make sure you discuss any questions you have with your health care provider. Document Revised: 02/24/2020 Document Reviewed: 02/24/2020 Elsevier Patient Education  2023 [employer].

## 2023-11-06 ENCOUNTER — Other Ambulatory Visit: Payer: Self-pay

## 2023-11-06 ENCOUNTER — Observation Stay
Admission: EM | Admit: 2023-11-06 | Discharge: 2023-11-06 | Disposition: A | Discharge status: Home / Self Care | Payer: 59 | Source: Home / Self Care | Admitting: Obstetrics and Gynecology

## 2023-11-06 ENCOUNTER — Encounter
Admission: RE | Admit: 2023-11-06 | Discharge: 2023-11-06 | Disposition: A | Discharge status: Home / Self Care | Payer: 59 | Source: Ambulatory Visit | Attending: Obstetrics and Gynecology | Admitting: Obstetrics and Gynecology

## 2023-11-06 ENCOUNTER — Encounter: Payer: Self-pay | Admitting: Obstetrics and Gynecology

## 2023-11-06 DIAGNOSIS — Z3A38 38 weeks gestation of pregnancy: Secondary | ICD-10-CM | POA: Insufficient documentation

## 2023-11-06 DIAGNOSIS — Z01812 Encounter for preprocedural laboratory examination: Secondary | ICD-10-CM | POA: Insufficient documentation

## 2023-11-06 DIAGNOSIS — O288 Other abnormal findings on antenatal screening of mother: Principal | ICD-10-CM | POA: Diagnosis present

## 2023-11-06 DIAGNOSIS — Z87891 Personal history of nicotine dependence: Secondary | ICD-10-CM | POA: Insufficient documentation

## 2023-11-06 DIAGNOSIS — O09523 Supervision of elderly multigravida, third trimester: Secondary | ICD-10-CM | POA: Insufficient documentation

## 2023-11-06 DIAGNOSIS — O368330 Maternal care for abnormalities of the fetal heart rate or rhythm, third trimester, not applicable or unspecified: Principal | ICD-10-CM | POA: Insufficient documentation

## 2023-11-06 LAB — TYPE AND SCREEN
ABO/RH(D): A POS
Antibody Screen: NEGATIVE
Extend sample reason: UNDETERMINED

## 2023-11-06 LAB — CBC
HCT: 33.7 % — ABNORMAL LOW (ref 36.0–46.0)
Hemoglobin: 11 g/dL — ABNORMAL LOW (ref 12.0–15.0)
MCH: 28 pg (ref 26.0–34.0)
MCHC: 32.6 g/dL (ref 30.0–36.0)
MCV: 85.8 fL (ref 80.0–100.0)
Platelets: 173 10*3/uL (ref 150–400)
RBC: 3.93 MIL/uL (ref 3.87–5.11)
RDW: 15.2 % (ref 11.5–15.5)
WBC: 10.1 10*3/uL (ref 4.0–10.5)
nRBC: 0 % (ref 0.0–0.2)

## 2023-11-06 LAB — BASIC METABOLIC PANEL
Anion gap: 8 (ref 5–15)
BUN: 9 mg/dL (ref 6–20)
CO2: 21 mmol/L — ABNORMAL LOW (ref 22–32)
Calcium: 8.6 mg/dL — ABNORMAL LOW (ref 8.9–10.3)
Chloride: 108 mmol/L (ref 98–111)
Creatinine, Ser: 0.54 mg/dL (ref 0.44–1.00)
GFR, Estimated: >60 mL/min (ref >60)
Glucose, Bld: 86 mg/dL (ref 70–99)
Potassium: 3.7 mmol/L (ref 3.5–5.1)
Sodium: 137 mmol/L (ref 135–145)

## 2023-11-06 LAB — RAPID HIV SCREEN (HIV 1/2 AB+AG)
HIV 1/2 Antibodies: NONREACTIVE
HIV-1 P24 Antigen - HIV24: NONREACTIVE

## 2023-11-06 MED ORDER — ZOLPIDEM TARTRATE 5 MG PO TABS: 5.0000 mg | ORAL_TABLET | Freq: Every evening | ORAL | Status: DC | PRN

## 2023-11-06 MED ORDER — DOCUSATE SODIUM 100 MG PO CAPS: 100.0000 mg | ORAL_CAPSULE | Freq: Every day | ORAL | Status: DC

## 2023-11-06 MED ORDER — ACETAMINOPHEN 325 MG PO TABS: 650.0000 mg | ORAL_TABLET | ORAL | Status: DC | PRN

## 2023-11-06 MED ORDER — CALCIUM CARBONATE ANTACID 500 MG PO CHEW: 2.0000 | CHEWABLE_TABLET | ORAL | Status: DC | PRN

## 2023-11-06 MED ORDER — PRENATAL MULTIVITAMIN CH: 1.0000 | ORAL_TABLET | Freq: Every day | ORAL | Status: DC

## 2023-11-06 NOTE — Discharge Summary (Signed)
Wendy Hunt is a 37 y.o. female. She is at [redacted]w[redacted]d gestation. Patient's last menstrual period was 01/07/2023 (approximate). Estimated Date of Delivery: 11/15/23  Prenatal care site: [facility]    Current pregnancy complicated by:  - history of cesarean section, planning repeat cesarean section - history of gHTN - elevated 1hr GTT - AMA - tachycardia - carpal tunnel syndrome  Chief complaint: non-reactive NST in the office  She was seen in the office for an NST for AMA and her NST was nonreactive d/t difficulty tracing fetal heart rate. She is sent over for an NST in L&D.  S: Resting comfortably. Occasional CTX, no VB.no LOF,  Active fetal movement.  Denies: HA, visual changes, SOB, or RUQ/epigastric pain  Maternal Medical History:   Past Medical History:  Diagnosis Date   Amenorrhea    Anemia    Excess, menstruation    Palpitations    RAD (reactive airway disease) with wheezing    Tachycardia    Transient elevated blood pressure    Vaginal bleeding in pregnancy     Past Surgical History:  Procedure Laterality Date   CESAREAN SECTION  November 2013   HYSTEROSCOPY WITH D & C  12/09/2019   Procedure: DILATATION AND CURETTAGE /HYSTEROSCOPY;  Surgeon: Schermerhorn, Ihor Austin, MD;  Location: [facility] ORS;  Service: Gynecology;;   WISDOM TOOTH EXTRACTION  2003    Allergies  Allergen Reactions   Epinephrine Palpitations   Lidocaine-Epinephrine (Pf) Palpitations   Sulfa Antibiotics Rash    Prior to Admission medications   Medication Sig Start Date End Date Taking? Authorizing Provider  albuterol (VENTOLIN HFA) 108 (90 Base) MCG/ACT inhaler INHALE 1-2 PUFFS BY MOUTH EVERY 6 HOURS AS NEEDED FOR WHEEZE OR SHORTNESS OF BREATH 03/22/22  Yes Bacigalupo, Marzella Schlein, MD  Prenatal Vit-Fe Fumarate-FA (PRENATAL MULTIVITAMIN) TABS tablet Take 1 tablet by mouth daily.   Yes [provider]      Social History: She  reports that she quit smoking about 11 years ago.  Her smoking use included cigarettes. She started smoking about 19 years ago. She has a 4 pack-year smoking history. She has never used smokeless tobacco. She reports that she does not currently use alcohol. She reports that she does not use drugs.  Family History: family history includes Angina in her maternal grandmother; Breast cancer in her paternal grandmother and another family member; Diabetes in her maternal grandfather, paternal grandmother, and another family member; Esophageal cancer in her paternal grandfather; Glaucoma in an other family member; Healthy in her father and mother; Hyperlipidemia in her mother.  no history of gyn cancers  Review of Systems: A full review of systems was performed and negative except as noted in the HPI.     O:  BP 138/84 (BP Location: Left Arm)   Pulse (!) 107   Temp 98.2 F (36.8 C) (Oral)   Resp 17   Ht 4\' 11"  (1.499 m)   Wt 86.2 kg   LMP 01/07/2023 (Approximate)   BMI 38.38 kg/m  BP 119/85 Results for orders placed or performed during the hospital encounter of 11/06/23 (from the past 48 hour(s))  CBC   Collection Time: 11/06/23  3:01 PM  Result Value Ref Range   WBC 10.1 4.0 - 10.5 K/uL   RBC 3.93 3.87 - 5.11 MIL/uL   Hemoglobin 11.0 (L) 12.0 - 15.0 g/dL   HCT 20.2 (L) 54.2 - 70.6 %   MCV 85.8 80.0 - 100.0 fL   MCH 28.0 26.0 -  34.0 pg   MCHC 32.6 30.0 - 36.0 g/dL   RDW 62.1 30.8 - 65.7 %   Platelets 173 150 - 400 K/uL   nRBC 0.0 0.0 - 0.2 %  Basic metabolic panel   Collection Time: 11/06/23  3:01 PM  Result Value Ref Range   Sodium 137 135 - 145 mmol/L   Potassium 3.7 3.5 - 5.1 mmol/L   Chloride 108 98 - 111 mmol/L   CO2 21 (L) 22 - 32 mmol/L   Glucose, Bld 86 70 - 99 mg/dL   BUN 9 6 - 20 mg/dL   Creatinine, Ser 8.46 0.44 - 1.00 mg/dL   Calcium 8.6 (L) 8.9 - 10.3 mg/dL   GFR, Estimated >96 >29 mL/min   Anion gap 8 5 - 15  Rapid HIV screen (HIV 1/2 Ab+Ag)   Collection Time: 11/06/23  3:01 PM  Result Value Ref Range   HIV-1  P24 Antigen - HIV24 NON REACTIVE NON REACTIVE   HIV 1/2 Antibodies NON REACTIVE NON REACTIVE   Interpretation (HIV Ag Ab)      A non reactive test result means that HIV 1 or HIV 2 antibodies and HIV 1 p24 antigen were not detected in the specimen.  Type and screen [facility]   Collection Time: 11/06/23  3:01 PM  Result Value Ref Range   ABO/RH(D) A POS    Antibody Screen NEG    Sample Expiration 11/09/2023,2359    Extend sample reason      PREGNANT WITHIN 3 MONTHS, UNABLE TO EXTEND Performed at [facility], [address] [place], [place] [address]      Constitutional: NAD, AAOx3  HE/ENT: extraocular movements grossly intact, moist mucous membranes CV: RRR PULM: nl respiratory effort, CTABL     Abd: gravid, non-tender, non-distended, soft      Ext: Non-tender, Nonedematous   Psych: mood appropriate, speech normal Pelvic: closed/thick/high  Pelvic exam: normal external genitalia, vulva, vagina, cervix, uterus and adnexa.  Fetal  monitoring: Cat 1 Appropriate for GA Baseline: 125bpm Variability: moderate Accelerations: present x >2 Decelerations absent Time Uterine contractions: regular, q1.5-2min, palpate mild  A/P: 37 y.o. [redacted]w[redacted]d here for antenatal surveillance for nonreactive NST in the office  Principle Diagnosis:  Reactive NST  Labor: not present.  Fetal Wellbeing: Reassuring Cat 1 tracing. Reactive NST  D/c home stable, precautions reviewed, follow-up as scheduled. Repeat cesarean section scheduled in 2 days.   [name], CNM 11/06/2023 7:31 PM

## 2023-11-06 NOTE — Progress Notes (Signed)
Pt ambulatory at time of discharge with instructions to to arrive for her scheduled cesarean section. Pt verbalized understanding and will arrive as planned. No further questions at this time.

## 2023-11-06 NOTE — OB Triage Note (Signed)
Pt presents from office for NST ?

## 2023-11-07 LAB — RPR: RPR Ser Ql: NONREACTIVE

## 2023-11-07 MED ORDER — CEFAZOLIN SODIUM-DEXTROSE 2-4 GM/100ML-% IV SOLN
2.0000 g | INTRAVENOUS | Status: AC
  Administered 2023-11-08: 2 g via INTRAVENOUS
  Filled 2023-11-07: qty 100

## 2023-11-07 MED ORDER — ORAL CARE MOUTH RINSE: 15.0000 mL | Freq: Once | OROMUCOSAL | Status: AC

## 2023-11-07 MED ORDER — LACTATED RINGERS IV SOLN
INTRAVENOUS | Status: DC
Start: 2023-11-07 — End: 2023-11-08

## 2023-11-07 MED ORDER — GABAPENTIN 300 MG PO CAPS
300.0000 mg | ORAL_CAPSULE | Freq: Once | ORAL | Status: AC
  Administered 2023-11-08: 300 mg via ORAL
  Filled 2023-11-07: qty 1

## 2023-11-07 MED ORDER — SOD CITRATE-CITRIC ACID 500-334 MG/5ML PO SOLN
30.0000 mL | ORAL | Status: AC
  Administered 2023-11-08: 30 mL via ORAL

## 2023-11-07 MED ORDER — CHLORHEXIDINE GLUCONATE 0.12 % MT SOLN
15.0000 mL | Freq: Once | OROMUCOSAL | Status: AC
  Administered 2023-11-08: 15 mL via OROMUCOSAL
  Filled 2023-11-07: qty 15

## 2023-11-07 MED ORDER — ACETAMINOPHEN 500 MG PO TABS
1000.0000 mg | ORAL_TABLET | Freq: Once | ORAL | Status: AC
  Administered 2023-11-08: 1000 mg via ORAL
  Filled 2023-11-07: qty 2

## 2023-11-08 ENCOUNTER — Inpatient Hospital Stay
Admission: RE | Admit: 2023-11-08 | Discharge: 2023-11-10 | DRG: 787 | Disposition: A | Discharge status: Home / Self Care | Payer: 59 | Attending: Obstetrics | Admitting: Obstetrics

## 2023-11-08 ENCOUNTER — Other Ambulatory Visit: Payer: Self-pay

## 2023-11-08 ENCOUNTER — Encounter: Payer: Self-pay | Admitting: Obstetrics and Gynecology

## 2023-11-08 ENCOUNTER — Inpatient Hospital Stay: Payer: 59

## 2023-11-08 ENCOUNTER — Inpatient Hospital Stay: Payer: 59 | Admitting: Urgent Care

## 2023-11-08 ENCOUNTER — Inpatient Hospital Stay: Payer: 59 | Admitting: Certified Registered"

## 2023-11-08 ENCOUNTER — Encounter
Admission: RE | Disposition: A | Discharge status: Home / Self Care | Payer: Self-pay | Source: Home / Self Care | Attending: Obstetrics and Gynecology

## 2023-11-08 DIAGNOSIS — G5600 Carpal tunnel syndrome, unspecified upper limb: Secondary | ICD-10-CM | POA: Diagnosis present

## 2023-11-08 DIAGNOSIS — Z87891 Personal history of nicotine dependence: Secondary | ICD-10-CM

## 2023-11-08 DIAGNOSIS — Z3A39 39 weeks gestation of pregnancy: Secondary | ICD-10-CM

## 2023-11-08 DIAGNOSIS — Z833 Family history of diabetes mellitus: Secondary | ICD-10-CM | POA: Diagnosis not present

## 2023-11-08 DIAGNOSIS — Z23 Encounter for immunization: Secondary | ICD-10-CM | POA: Diagnosis not present

## 2023-11-08 DIAGNOSIS — Z800 Family history of malignant neoplasm of digestive organs: Secondary | ICD-10-CM

## 2023-11-08 DIAGNOSIS — O34211 Maternal care for low transverse scar from previous cesarean delivery: Secondary | ICD-10-CM | POA: Diagnosis present

## 2023-11-08 DIAGNOSIS — O99354 Diseases of the nervous system complicating childbirth: Secondary | ICD-10-CM | POA: Diagnosis present

## 2023-11-08 DIAGNOSIS — R000 Tachycardia, unspecified: Secondary | ICD-10-CM | POA: Diagnosis present

## 2023-11-08 DIAGNOSIS — O321XX0 Maternal care for breech presentation, not applicable or unspecified: Secondary | ICD-10-CM | POA: Diagnosis present

## 2023-11-08 DIAGNOSIS — O9081 Anemia of the puerperium: Secondary | ICD-10-CM | POA: Diagnosis not present

## 2023-11-08 DIAGNOSIS — D62 Acute posthemorrhagic anemia: Secondary | ICD-10-CM | POA: Diagnosis not present

## 2023-11-08 DIAGNOSIS — Z803 Family history of malignant neoplasm of breast: Secondary | ICD-10-CM

## 2023-11-08 DIAGNOSIS — O99892 Other specified diseases and conditions complicating childbirth: Secondary | ICD-10-CM | POA: Diagnosis present

## 2023-11-08 DIAGNOSIS — I4711 Inappropriate sinus tachycardia, so stated: Secondary | ICD-10-CM | POA: Diagnosis present

## 2023-11-08 DIAGNOSIS — O34219 Maternal care for unspecified type scar from previous cesarean delivery: Principal | ICD-10-CM | POA: Diagnosis present

## 2023-11-08 LAB — COMPREHENSIVE METABOLIC PANEL
ALT: 14 U/L (ref 0–44)
AST: 24 U/L (ref 15–41)
Albumin: 2.3 g/dL — ABNORMAL LOW (ref 3.5–5.0)
Alkaline Phosphatase: 65 U/L (ref 38–126)
Anion gap: 6 (ref 5–15)
BUN: 10 mg/dL (ref 6–20)
CO2: 23 mmol/L (ref 22–32)
Calcium: 8.5 mg/dL — ABNORMAL LOW (ref 8.9–10.3)
Chloride: 108 mmol/L (ref 98–111)
Creatinine, Ser: 0.77 mg/dL (ref 0.44–1.00)
GFR, Estimated: >60 mL/min (ref >60)
Glucose, Bld: 110 mg/dL — ABNORMAL HIGH (ref 70–99)
Potassium: 3.6 mmol/L (ref 3.5–5.1)
Sodium: 137 mmol/L (ref 135–145)
Total Bilirubin: 0.5 mg/dL (ref <1.2)
Total Protein: 5.1 g/dL — ABNORMAL LOW (ref 6.5–8.1)

## 2023-11-08 LAB — CBC
HCT: 27.4 % — ABNORMAL LOW (ref 36.0–46.0)
Hemoglobin: 9.2 g/dL — ABNORMAL LOW (ref 12.0–15.0)
MCH: 28.3 pg (ref 26.0–34.0)
MCHC: 33.6 g/dL (ref 30.0–36.0)
MCV: 84.3 fL (ref 80.0–100.0)
Platelets: 161 10*3/uL (ref 150–400)
RBC: 3.25 MIL/uL — ABNORMAL LOW (ref 3.87–5.11)
RDW: 15.3 % (ref 11.5–15.5)
WBC: 10.3 10*3/uL (ref 4.0–10.5)
nRBC: 0 % (ref 0.0–0.2)

## 2023-11-08 SURGERY — Surgical Case
Anesthesia: Spinal

## 2023-11-08 MED ORDER — MORPHINE SULFATE (PF) 2 MG/ML IV SOLN: 1.0000 mg | INTRAVENOUS | Status: DC | PRN

## 2023-11-08 MED ORDER — WITCH HAZEL-GLYCERIN EX PADS: 1.0000 | MEDICATED_PAD | CUTANEOUS | Status: DC | PRN

## 2023-11-08 MED ORDER — DIBUCAINE (PERIANAL) 1 % EX OINT: 1.0000 | TOPICAL_OINTMENT | CUTANEOUS | Status: DC | PRN

## 2023-11-08 MED ORDER — GABAPENTIN 300 MG PO CAPS
300.0000 mg | ORAL_CAPSULE | Freq: Every day | ORAL | Status: DC
  Administered 2023-11-09 (×2): 300 mg via ORAL
  Filled 2023-11-08 (×2): qty 1

## 2023-11-08 MED ORDER — COCONUT OIL OIL: 1.0000 | TOPICAL_OIL | Status: DC | PRN

## 2023-11-08 MED ORDER — KETOROLAC TROMETHAMINE 30 MG/ML IJ SOLN
30.0000 mg | Freq: Four times a day (QID) | INTRAMUSCULAR | Status: AC
Start: 2023-11-08 — End: 2023-11-09

## 2023-11-08 MED ORDER — PHENYLEPHRINE HCL-NACL 20-0.9 MG/250ML-% IV SOLN
INTRAVENOUS | Status: DC | PRN
  Administered 2023-11-08: 50 ug/min via INTRAVENOUS

## 2023-11-08 MED ORDER — SCOPOLAMINE 1 MG/3DAYS TD PT72: 1.0000 | MEDICATED_PATCH | Freq: Once | TRANSDERMAL | Status: DC

## 2023-11-08 MED ORDER — MORPHINE SULFATE (PF) 0.5 MG/ML IJ SOLN
INTRAMUSCULAR | Status: DC | PRN
  Administered 2023-11-08: .1 mg via INTRATHECAL

## 2023-11-08 MED ORDER — BUPIVACAINE HCL (PF) 0.5 % IJ SOLN
INTRAMUSCULAR | Status: DC | PRN
  Administered 2023-11-08: 60 mL

## 2023-11-08 MED ORDER — ONDANSETRON HCL 4 MG/2ML IJ SOLN
INTRAMUSCULAR | Status: AC
  Filled 2023-11-08: qty 2

## 2023-11-08 MED ORDER — OXYTOCIN-SODIUM CHLORIDE 30-0.9 UT/500ML-% IV SOLN
INTRAVENOUS | Status: DC | PRN
  Administered 2023-11-08: 30 [IU] via INTRAVENOUS

## 2023-11-08 MED ORDER — SENNOSIDES-DOCUSATE SODIUM 8.6-50 MG PO TABS
2.0000 | ORAL_TABLET | Freq: Every day | ORAL | Status: DC
  Administered 2023-11-09 – 2023-11-10 (×2): 2 via ORAL
  Filled 2023-11-08 (×2): qty 2

## 2023-11-08 MED ORDER — NALOXONE HCL 0.4 MG/ML IJ SOLN: 0.4000 mg | INTRAMUSCULAR | Status: DC | PRN

## 2023-11-08 MED ORDER — DIPHENHYDRAMINE HCL 25 MG PO CAPS: 25.0000 mg | ORAL_CAPSULE | ORAL | Status: DC | PRN

## 2023-11-08 MED ORDER — MEPERIDINE HCL 25 MG/ML IJ SOLN: 6.2500 mg | INTRAMUSCULAR | Status: DC | PRN

## 2023-11-08 MED ORDER — OXYTOCIN-SODIUM CHLORIDE 30-0.9 UT/500ML-% IV SOLN
2.5000 [IU]/h | INTRAVENOUS | Status: AC
  Administered 2023-11-08: 2.5 [IU]/h via INTRAVENOUS

## 2023-11-08 MED ORDER — OXYTOCIN-SODIUM CHLORIDE 30-0.9 UT/500ML-% IV SOLN
INTRAVENOUS | Status: AC
  Filled 2023-11-08: qty 500

## 2023-11-08 MED ORDER — LIDOCAINE HCL (PF) 1 % IJ SOLN
INTRAMUSCULAR | Status: DC | PRN
  Administered 2023-11-08: 3 mL via SUBCUTANEOUS

## 2023-11-08 MED ORDER — LACTATED RINGERS IV BOLUS
500.0000 mL | Freq: Once | INTRAVENOUS | Status: AC
Start: 2023-11-08 — End: 2023-11-08
  Administered 2023-11-08: 500 mL via INTRAVENOUS

## 2023-11-08 MED ORDER — ACETAMINOPHEN 500 MG PO TABS
1000.0000 mg | ORAL_TABLET | Freq: Four times a day (QID) | ORAL | Status: AC
Start: 2023-11-08 — End: 2023-11-09
  Administered 2023-11-08 – 2023-11-09 (×3): 1000 mg via ORAL
  Filled 2023-11-08 (×5): qty 2

## 2023-11-08 MED ORDER — FENTANYL CITRATE (PF) 100 MCG/2ML IJ SOLN
INTRAMUSCULAR | Status: AC
  Filled 2023-11-08: qty 2

## 2023-11-08 MED ORDER — SIMETHICONE 80 MG PO CHEW
80.0000 mg | CHEWABLE_TABLET | ORAL | Status: DC | PRN
  Filled 2023-11-08: qty 1

## 2023-11-08 MED ORDER — SODIUM CHLORIDE 0.9% FLUSH: 3.0000 mL | INTRAVENOUS | Status: DC | PRN

## 2023-11-08 MED ORDER — NALOXONE HCL 4 MG/10ML IJ SOLN: 1.0000 ug/kg/h | INTRAVENOUS | Status: DC | PRN

## 2023-11-08 MED ORDER — OXYCODONE HCL 5 MG PO TABS: 5.0000 mg | ORAL_TABLET | ORAL | Status: AC | PRN

## 2023-11-08 MED ORDER — MENTHOL 3 MG MT LOZG: 1.0000 | LOZENGE | OROMUCOSAL | Status: DC | PRN

## 2023-11-08 MED ORDER — 0.9 % SODIUM CHLORIDE (POUR BTL) OPTIME
TOPICAL | Status: DC | PRN
  Administered 2023-11-08: 1000 mL

## 2023-11-08 MED ORDER — MEASLES, MUMPS & RUBELLA VAC IJ SOLR
0.5000 mL | Freq: Once | INTRAMUSCULAR | Status: AC
  Administered 2023-11-10: 0.5 mL via SUBCUTANEOUS
  Filled 2023-11-08 (×2): qty 0.5

## 2023-11-08 MED ORDER — PRENATAL MULTIVITAMIN CH
1.0000 | ORAL_TABLET | Freq: Every day | ORAL | Status: DC
  Administered 2023-11-08 – 2023-11-10 (×3): 1 via ORAL
  Filled 2023-11-08 (×3): qty 1

## 2023-11-08 MED ORDER — ALBUTEROL SULFATE HFA 108 (90 BASE) MCG/ACT IN AERS: 1.0000 | INHALATION_SPRAY | RESPIRATORY_TRACT | Status: DC | PRN

## 2023-11-08 MED ORDER — ONDANSETRON HCL 4 MG/2ML IJ SOLN
INTRAMUSCULAR | Status: DC | PRN
  Administered 2023-11-08: 4 mg via INTRAVENOUS

## 2023-11-08 MED ORDER — KETOROLAC TROMETHAMINE 30 MG/ML IJ SOLN
30.0000 mg | Freq: Four times a day (QID) | INTRAMUSCULAR | Status: AC
Start: 2023-11-08 — End: 2023-11-09
  Administered 2023-11-08 – 2023-11-09 (×3): 30 mg via INTRAVENOUS
  Filled 2023-11-08 (×4): qty 1

## 2023-11-08 MED ORDER — BUPIVACAINE IN DEXTROSE 0.75-8.25 % IT SOLN
INTRATHECAL | Status: DC | PRN
  Administered 2023-11-08: 1.4 mL via INTRATHECAL

## 2023-11-08 MED ORDER — MORPHINE SULFATE (PF) 0.5 MG/ML IJ SOLN
INTRAMUSCULAR | Status: AC
  Filled 2023-11-08: qty 10

## 2023-11-08 MED ORDER — PHENYLEPHRINE HCL-NACL 20-0.9 MG/250ML-% IV SOLN
INTRAVENOUS | Status: AC
  Filled 2023-11-08: qty 250

## 2023-11-08 MED ORDER — FENTANYL CITRATE (PF) 100 MCG/2ML IJ SOLN
INTRAMUSCULAR | Status: DC | PRN
  Administered 2023-11-08: 15 ug via INTRATHECAL

## 2023-11-08 MED ORDER — SOD CITRATE-CITRIC ACID 500-334 MG/5ML PO SOLN
ORAL | Status: AC
  Filled 2023-11-08: qty 15

## 2023-11-08 MED ORDER — ZOLPIDEM TARTRATE 5 MG PO TABS: 5.0000 mg | ORAL_TABLET | Freq: Every evening | ORAL | Status: DC | PRN

## 2023-11-08 MED ORDER — SIMETHICONE 80 MG PO CHEW
80.0000 mg | CHEWABLE_TABLET | Freq: Three times a day (TID) | ORAL | Status: DC
  Administered 2023-11-08 – 2023-11-10 (×6): 80 mg via ORAL
  Filled 2023-11-08 (×7): qty 1

## 2023-11-08 MED ORDER — BUPIVACAINE HCL (PF) 0.25 % IJ SOLN
INTRAMUSCULAR | Status: AC
  Filled 2023-11-08: qty 60

## 2023-11-08 MED ORDER — DIPHENHYDRAMINE HCL 50 MG/ML IJ SOLN
12.5000 mg | INTRAMUSCULAR | Status: DC | PRN
Start: 2023-11-08 — End: 2023-11-09

## 2023-11-08 MED ORDER — ONDANSETRON HCL 4 MG/2ML IJ SOLN: 4.0000 mg | Freq: Three times a day (TID) | INTRAMUSCULAR | Status: DC | PRN

## 2023-11-08 MED ORDER — BUPIVACAINE HCL (PF) 0.5 % IJ SOLN
INTRAMUSCULAR | Status: AC
  Filled 2023-11-08: qty 60

## 2023-11-08 MED ORDER — ENOXAPARIN SODIUM 40 MG/0.4ML IJ SOSY
40.0000 mg | PREFILLED_SYRINGE | INTRAMUSCULAR | Status: DC
  Administered 2023-11-09 – 2023-11-10 (×2): 40 mg via SUBCUTANEOUS
  Filled 2023-11-08 (×2): qty 0.4

## 2023-11-08 SURGICAL SUPPLY — 27 items
BARRIER ADHS 3X4 INTERCEED (GAUZE/BANDAGES/DRESSINGS) ×1 IMPLANT
CHLORAPREP W/TINT 26 (MISCELLANEOUS) ×1 IMPLANT
DRSG TELFA 3X8 NADH STRL (GAUZE/BANDAGES/DRESSINGS) ×1 IMPLANT
ELECT CAUTERY BLADE 6.4 (BLADE) ×1 IMPLANT
ELECT REM PT RETURN 9FT ADLT (ELECTROSURGICAL) ×1
ELECTRODE REM PT RTRN 9FT ADLT (ELECTROSURGICAL) ×1 IMPLANT
GAUZE SPONGE 4X4 12PLY STRL (GAUZE/BANDAGES/DRESSINGS) ×1 IMPLANT
GLOVE SURG SYN 8.0 (GLOVE) ×1 IMPLANT
GLOVE SURG SYN 8.0 PF PI (GLOVE) ×1 IMPLANT
GOWN STRL REUS W/ TWL LRG LVL3 (GOWN DISPOSABLE) ×2 IMPLANT
GOWN STRL REUS W/ TWL XL LVL3 (GOWN DISPOSABLE) ×1 IMPLANT
MANIFOLD NEPTUNE II (INSTRUMENTS) ×1 IMPLANT
MAT PREVALON FULL STRYKER (MISCELLANEOUS) ×1 IMPLANT
NDL HYPO 22X1.5 SAFETY MO (MISCELLANEOUS) ×1 IMPLANT
NEEDLE HYPO 22X1.5 SAFETY MO (MISCELLANEOUS) ×1 IMPLANT
NS IRRIG 1000ML POUR BTL (IV SOLUTION) ×1 IMPLANT
PACK C SECTION AR (MISCELLANEOUS) ×1 IMPLANT
PAD OB MATERNITY 4.3X12.25 (PERSONAL CARE ITEMS) ×1 IMPLANT
PAD PREP OB/GYN DISP 24X41 (PERSONAL CARE ITEMS) ×1 IMPLANT
SCRUB CHG 4% DYNA-HEX 4OZ (MISCELLANEOUS) ×1 IMPLANT
STRAP SAFETY 5IN WIDE (MISCELLANEOUS) ×1 IMPLANT
SUT CHROMIC 1 CTX 36 (SUTURE) ×3 IMPLANT
SUT PLAIN GUT 0 (SUTURE) ×2 IMPLANT
SUT VIC AB 0 CT1 36 (SUTURE) ×2 IMPLANT
SYR 30ML LL (SYRINGE) ×2 IMPLANT
TRAP FLUID SMOKE EVACUATOR (MISCELLANEOUS) ×1 IMPLANT
WATER STERILE IRR 500ML POUR (IV SOLUTION) ×1 IMPLANT

## 2023-11-08 NOTE — Anesthesia Preprocedure Evaluation (Signed)
Anesthesia Evaluation  Patient identified by MRN, date of birth, ID band Patient awake  General Assessment Comment:  Patient states that local anesthetics don't work as well for her (dentists have to give extra lidocaine mid-dental procedure). No issues with c/s via epidural 11 years ago, though she does note that she got ketamine intra-op, she is not sure why.  Reviewed: Allergy & Precautions, NPO status , Patient's Chart, lab work & pertinent test results  History of Anesthesia Complications Negative for: history of anesthetic complications  Airway Mallampati: II  TM Distance: >3 FB Neck ROM: Full    Dental no notable dental hx. (+) Teeth Intact   Pulmonary asthma , neg sleep apnea, neg COPD, Patient abstained from smoking.Not current smoker, former smoker   Pulmonary exam normal breath sounds clear to auscultation       Cardiovascular Exercise Tolerance: Good METS(-) hypertension(-) CAD and (-) Past MI negative cardio ROS (-) dysrhythmias  Rhythm:Regular Rate:Normal - Systolic murmurs    Neuro/Psych negative neurological ROS  negative psych ROS   GI/Hepatic ,neg GERD  ,,(+)     (-) substance abuse    Endo/Other  neg diabetes    Renal/GU negative Renal ROS     Musculoskeletal   Abdominal  (+) + obese  Peds  Hematology  (+) Blood dyscrasia, anemia Denies bleeding disorder or anticoagulant use.   Anesthesia Other Findings Past Medical History: No date: Amenorrhea No date: Anemia No date: Excess, menstruation No date: Palpitations No date: RAD (reactive airway disease) with wheezing No date: Tachycardia No date: Transient elevated blood pressure No date: Vaginal bleeding in pregnancy  Reproductive/Obstetrics (+) Pregnancy                             Anesthesia Physical Anesthesia Plan  ASA: 2  Anesthesia Plan: Spinal   Post-op Pain Management: Gabapentin PO (pre-op)*, Tylenol  PO (pre-op)* and Toradol IV (intra-op)*   Induction:   PONV Risk Score and Plan: 3 and Ondansetron, Dexamethasone and Treatment may vary due to age or medical condition  Airway Management Planned: Natural Airway  Additional Equipment:   Intra-op Plan:   Post-operative Plan:   Informed Consent: I have reviewed the patients History and Physical, chart, labs and discussed the procedure including the risks, benefits and alternatives for the proposed anesthesia with the patient or authorized representative who has indicated his/her understanding and acceptance.       Plan Discussed with: CRNA and Surgeon  Anesthesia Plan Comments: (Discussed R/B/A of neuraxial anesthesia technique with patient: - rare risks of spinal/epidural hematoma, nerve damage, infection - Risk of PDPH - Risk of itching - Risk of nausea and vomiting - Risk of conversion to general anesthesia (especially in light of patient being "resistant" to local anesthetics) and its associated risks, including sore throat, damage to lips/teeth/oropharynx, and rare risks such as cardiac and respiratory events. - Risk of surgical bleeding requiring blood products - Risk of allergic reactions Discussed the role of CRNA in patient's perioperative care.  Patient voiced understanding.)       Anesthesia Quick Evaluation

## 2023-11-08 NOTE — Transfer of Care (Signed)
Immediate Anesthesia Transfer of Care Note  Patient: Wendy Hunt  Procedure(s) Performed: REPEAT CESAREAN SECTION  Patient Location: PACU and Mother/Baby  Anesthesia Type:Spinal  Level of Consciousness: awake, alert , and oriented  Airway & Oxygen Therapy: Patient Spontanous Breathing  Post-op Assessment: Report given to RN and Post -op Vital signs reviewed and stable  Post vital signs: Reviewed and stable  Last Vitals:  Vitals Value Taken Time  BP 109/69 (83) 11/08/23 0850  Temp    Pulse 99   Resp 17   SpO2 97     Last Pain:  Vitals:   11/08/23 0605  TempSrc:   PainSc: 0-No pain         Complications: No notable events documented.

## 2023-11-08 NOTE — Op Note (Signed)
Wendy Hunt, VETSCH MEDICAL RECORD NO: [id number] ACCOUNT NO: [id number] DATE OF BIRTH: 29-Jul-1986 FACILITY: [facility] LOCATION: [facility] PHYSICIAN: Suzy Bouchard, MD  Operative Report   DATE OF PROCEDURE: 11/08/2023  PREOPERATIVE DIAGNOSES: 1.  39+0 weeks estimated gestational age. 2.  Previous cesarean section. 3.  Desires repeat cesarean section.  POSTOPERATIVE DIAGNOSES: 1.  39+0 weeks estimated gestational age. 2.  Elective repeat cesarean section. 3.  Unstable fetal lie. 4.  Vigorous female delivered.  PROCEDURE: 1.  Repeat low transverse cesarean section. 2.  Breech extraction.  ANESTHESIA:  Spinal.  SURGEON:  Suzy Bouchard, MD  FIRST ASSISTANT:  Donato Schultz, certified nurse midwife.  INDICATIONS:  A 37 year old gravida 2 para 1 at 39+0 weeks estimated gestational age.  The patient with a prior history of cesarean section desires repeat cesarean section.  DESCRIPTION OF PROCEDURE:  After adequate spinal anesthesia, the patient was placed in the dorsal supine position and hip rolled on the right side.  The patient's abdomen and vagina were prepped and draped in normal sterile fashion.  The patient did  receive 2 grams of IV Ancef for surgical prophylaxis.  Timeout was performed.  A Pfannenstiel incision was made 2 fingerbreadths above the symphysis pubis.  Sharp dissection was used to identify the fascia.  The fascia was opened in the midline and  opened in a transverse fashion.  The superior aspect of the fascia was grasped with Kocher clamps and the recti muscle was dissected free.  Omental tissue was noted to be adherent to the anterior fascia.  Blunt dissection was used to identify the  peritoneum and open the peritoneum.  The vesicular uterine peritoneal fold was undeveloped and therefore a direct low transverse uterine incision was made.  Upon entry into the endometrial cavity, a large amount of amniotic fluid was noted.  Fluid was  clear.   Transverse lie was documented first with the delivery of the arm, which was then pushed back into the endometrial cavity and each leg was identified and legs were delivered through the incision.  Lower fetal abdomen and buttocks were grasped and  draped with a wound towel which allowed for delivery of each fetal arm and delivery of the head without difficulty.  The infant was dried and was not spontaneously crying.  Therefore, after 20 seconds, the cord was doubly clamped and infant was passed to  nursery staff who assigned Apgar scores of 8 and 9, fetal weight 4000 grams vigorous female.  Placenta was manually delivered and the uterus was exteriorized.  The endometrial cavity was wiped clean with laparotomy tape and the uterine incision was closed  with 1 chromic suture in a running locking fashion.  Two additional figure-of-eight sutures were required for hemostasis.  Fallopian tubes and nerves appeared normal.  Posterior cul-de-sac was irrigated and suctioned and the uterus was placed back into  the abdominal cavity.  The pericolic gutters were wiped clean with laparotomy tape and the uterine incision again appeared hemostatic.  Interceed was placed over the uterine incision in a T-shaped fashion.  Fascia was then closed with 0 Vicryl suture in  a running non-locking fashion.  Two separate sutures used.  The fascial edges were injected with a solution of 0.25% Marcaine 60 mL plus 20 mL normal saline.  30 mL solution was used for injection.  Subcutaneous tissues were irrigated and Bovie for  hemostasis and the skin was reapproximated with Insorb absorbable staples.  Good cosmetic effect.  There were no complications.  QUANTITATIVE  BLOOD LOSS:  719 mL.  URINE OUTPUT:  20 mL.  INTRAOPERATIVE FLUIDS:  500 mL.  DISPOSITION:  The patient tolerated the procedure well and was taken to the recovery room in good condition.    PUS D: 11/08/2023 9:07:26 am T: 11/08/2023 10:02:00 am  JOB: [id number]/ [id number]

## 2023-11-08 NOTE — Plan of Care (Signed)
  Problem: Education: Goal: Knowledge of the prescribed therapeutic regimen will improve Outcome: Progressing Goal: Understanding of sexual limitations or changes related to disease process or condition will improve Outcome: Progressing Goal: Individualized Educational Video(s) Outcome: Progressing   Problem: Self-Concept: Goal: Communication of feelings regarding changes in body function or appearance will improve Outcome: Progressing   Problem: Skin Integrity: Goal: Demonstration of wound healing without infection will improve Outcome: Progressing   Problem: Education: Goal: Knowledge of condition will improve Outcome: Progressing Goal: Individualized Educational Video(s) Outcome: Progressing Goal: Individualized Newborn Educational Video(s) Outcome: Progressing   Problem: Activity: Goal: Will verbalize the importance of balancing activity with adequate rest periods Outcome: Progressing Goal: Ability to tolerate increased activity will improve Outcome: Progressing   Problem: Coping: Goal: Ability to identify and utilize available resources and services will improve Outcome: Progressing   Problem: Life Cycle: Goal: Chance of risk for complications during the postpartum period will decrease Outcome: Progressing   Problem: Role Relationship: Goal: Ability to demonstrate positive interaction with newborn will improve Outcome: Progressing   Problem: Skin Integrity: Goal: Demonstration of wound healing without infection will improve Outcome: Progressing   

## 2023-11-08 NOTE — Brief Op Note (Signed)
11/08/2023  8:46 AM  PATIENT:  Anne Fu  37 y.o. female  PRE-OPERATIVE DIAGNOSIS:  elective repeat cesarean section   POST-OPERATIVE DIAGNOSIS:  elective repeat Transverse presentation   PROCEDURE:  Procedure(s): REPEAT CESAREAN SECTION (N/A), LTCS  Breech extraction  SURGEON:  Surgeons and Role:    * Tais Koestner, Ihor Austin, MD - Primary  PHYSICIAN ASSISTANT: Donato Schultz   ASSISTANTS: cst   ANESTHESIA:   spinal  EBL: QBL: 719 cc BLOOD ADMINISTERED:none  DRAINS: Urinary Catheter (Foley)   LOCAL MEDICATIONS USED:  MARCAINE     SPECIMEN:  No Specimen  DISPOSITION OF SPECIMEN:  N/A  COUNTS:  YES  TOURNIQUET:  * No tourniquets in log *  DICTATION: .Other Dictation: Dictation Number verbal  PLAN OF CARE: Admit to inpatient   PATIENT DISPOSITION:  PACU - hemodynamically stable.   Delay start of Pharmacological VTE agent (>24hrs) due to surgical blood loss or risk of bleeding: not applicable

## 2023-11-08 NOTE — Discharge Summary (Signed)
Obstetrical Discharge Summary  Patient Name: Wendy Hunt DOB: 1986/01/21 MRN: [id number]  Date of Admission: 11/08/2023 Date of Delivery: 11/08/23 Delivered by: Beverly Gust MD Date of Discharge: 11/10/23  Primary OB: [facility]  ONG:[id number]'L last menstrual period was 01/07/2023 (approximate). EDC Estimated Date of Delivery: 11/15/23 Gestational Age at Delivery: [redacted]w[redacted]d   Antepartum complications: polyhydramnios,LGA fetus, RAD, prior c/s , carpal tunnel,, AMA Admitting Diagnosis: elective repeat LTCS  Secondary Diagnosis: Patient Active Problem List   Diagnosis Date Noted   Previous cesarean delivery affecting pregnancy 11/08/2023   Cesarean delivery delivered 11/08/2023   Acute blood loss anemia (ABLA) 11/08/2023   Supervision of normal pregnancy 04/19/2023   Grade I internal hemorrhoids 06/11/2020   Personal history of COVID-19 05/26/2020   Inappropriate sinus tachycardia (HCC) 06/04/2019   Anemia, iron deficiency 07/17/2015   RAD (reactive airway disease) 07/17/2015   Airway hyperreactivity 07/17/2015    Augmentation: N/A Complications: None Intrapartum complications/course:  Date of Delivery: 11/10/2023  Delivered By: Beverly Gust MD Delivery Type: repeat cesarean section, low transverse incision Anesthesia:spinal  Placenta: manual Laceration:  Episiotomy: none Newborn Data: Female   Apgars 8/9 Wt : 4000gm  Postpartum Procedures:   Edinburgh:     11/08/2023    8:30 PM  Edinburgh Postnatal Depression Scale Screening Tool  I have been able to laugh and see the funny side of things. 0  I have looked forward with enjoyment to things. 0  I have blamed myself unnecessarily when things went wrong. 1  I have been anxious or worried for no good reason. 0  I have felt scared or panicky for no good reason. 0  Things have been getting on top of me. 0  I have been so unhappy that I have had difficulty sleeping. 0  I have felt sad or miserable. 0   I have been so unhappy that I have been crying. 0  The thought of harming myself has occurred to me. 0  Edinburgh Postnatal Depression Scale Total 1   (Cesarean Section):  Patient had an uncomplicated postpartum course.  By time of discharge on POD#2, her pain was controlled on oral pain medications; she had appropriate lochia and was ambulating, voiding without difficulty, tolerating regular diet and passing flatus.   She was deemed stable for discharge to home.    Discharge Physical Exam:  BP 109/78 (BP Location: Right Arm)   Pulse (!) 105 Comment: nurse Skyler notified  Temp 98 F (36.7 C) (Oral)   Resp 20   Ht 4\' 11"  (1.499 m)   Wt 86.2 kg   LMP 01/07/2023 (Approximate)   SpO2 99%   Breastfeeding Unknown   BMI 38.38 kg/m   General: NAD CV: RRR Pulm: CTABL, nl effort ABD: s/nd/nt, fundus firm and below the umbilicus Lochia: moderate Incision: c/d/i DVT Evaluation: LE non-ttp, no evidence of DVT on exam.  Hemoglobin  Date Value Ref Range Status  11/09/2023 10.0 (L) 12.0 - 15.0 g/dL Final  24/40/1027 25.3 11.1 - 15.9 g/dL Final   HCT  Date Value Ref Range Status  11/09/2023 29.3 (L) 36.0 - 46.0 % Final   Hematocrit  Date Value Ref Range Status  07/22/2020 40.7 34.0 - 46.6 % Final     Disposition: stable, discharge to home. Baby Feeding: breastmilk Baby Disposition: home with mom  Rh Immune globulin given: n/a Rubella vaccine given: yes Tdap vaccine given in AP or PP setting:  Flu vaccine given in AP or PP setting:  Rsv  received  in office 34 weeks  Risk assessment for postpartum VTE and prophylactic treatment: Very high risk factors: None High risk factors: None Moderate risk factors: Cesarean delivery  and BMI 30-40 kg/m2  Postpartum VTE prophylaxis with LMWH not indicated  Contraception: non hormonal  Prenatal Labs:   ABO, Rh:  A+ Antibody:  neg  Rubella:  Non immune , vz Immune RPR:   NR  HBsAg:   neg , Hep C : Neg  HIV:   neg GBS:   neg    Plan:  NUSRAT LINE was discharged to home in good condition. Follow-up appointment with delivering provider in 6 weeks.  Discharge Medications: Allergies as of 11/10/2023       Reactions   Epinephrine Palpitations   Sulfa Antibiotics Rash        Medication List     TAKE these medications    acetaminophen 500 MG tablet Commonly known as: TYLENOL Take 2 tablets (1,000 mg total) by mouth every 6 (six) hours.   albuterol 108 (90 Base) MCG/ACT inhaler Commonly known as: VENTOLIN HFA INHALE 1-2 PUFFS BY MOUTH EVERY 6 HOURS AS NEEDED FOR WHEEZE OR SHORTNESS OF BREATH   coconut oil Oil Apply 1 Application topically as needed.   dibucaine 1 % Oint Commonly known as: NUPERCAINAL Place 1 Application rectally as needed for hemorrhoids.   ferrous sulfate 325 (65 FE) MG tablet Take 1 tablet (325 mg total) by mouth daily with breakfast. Start taking on: November 11, 2023   ibuprofen 800 MG tablet Commonly known as: ADVIL Take 1 tablet (800 mg total) by mouth every 8 (eight) hours as needed.   oxyCODONE 5 MG immediate release tablet Commonly known as: Oxy IR/ROXICODONE Take 1-2 tablets (5-10 mg total) by mouth every 4 (four) hours as needed for moderate pain (pain score 4-6).   prenatal multivitamin Tabs tablet Take 1 tablet by mouth daily.   senna-docusate 8.6-50 MG tablet Commonly known as: Senokot-S Take 2 tablets by mouth daily. Start taking on: November 11, 2023   simethicone 80 MG chewable tablet Commonly known as: MYLICON Chew 1 tablet (80 mg total) by mouth as needed for flatulence.   witch hazel-glycerin pad Commonly known as: TUCKS Apply 1 Application topically as needed for hemorrhoids.         Follow-up Information     Schermerhorn, Ihor Austin, MD Follow up in 2 week(s).   Specialty: Obstetrics and Gynecology Why: postop Contact information: [address] [place] [address] [phone]                  Signed: Chari Manning CNM

## 2023-11-08 NOTE — Anesthesia Procedure Notes (Signed)
Spinal  Patient location during procedure: OR Start time: 11/08/2023 7:10 AM End time: 11/08/2023 7:15 AM Reason for block: surgical anesthesia Staffing Performed: resident/CRNA  Anesthesiologist: Corinda Gubler, MD Resident/CRNA: Elmarie Mainland, CRNA Performed by: Elmarie Mainland, CRNA Authorized by: Corinda Gubler, MD   Preanesthetic Checklist Completed: patient identified, IV checked, site marked, risks and benefits discussed, surgical consent, monitors and equipment checked, pre-op evaluation and timeout performed Spinal Block Patient position: sitting Prep: ChloraPrep Patient monitoring: heart rate, continuous pulse ox, blood pressure and cardiac monitor Approach: midline Location: L3-4 Injection technique: single-shot Needle Needle type: Introducer and Pencan  Needle gauge: 24 G Needle length: 9 cm Assessment Sensory level: T10 Events: CSF return Additional Notes Sterile aseptic technique used throughout the procedure.  Negative paresthesia. Negative blood return. Positive free-flowing CSF. Expiration date of kit checked and confirmed. Patient tolerated procedure well, without complications.

## 2023-11-08 NOTE — Progress Notes (Signed)
Pt ready for repeat LTCS . Labs reviewed . All questions answered . Proceed

## 2023-11-08 NOTE — Lactation Note (Signed)
This note was copied from a baby's chart. Lactation Consultation Note  Patient Name: Wendy Hunt Date: 11/08/2023 Age:38 hours Reason for consult: Follow-up assessment;Mother's request;Breastfeeding assistance;RN request   Maternal Data  This is mom's 2nd baby, C/S elective repeat. Mom with history of anemia, transient elevated BP,  and AMA.  Returned to assist mom with breastfeeding. Baby was awake. Mom had baby positioned well at left breast in cradle hold. Provided tips on how to maximize latch.   Has patient been taught Hand Expression?: Yes Does the patient have breastfeeding experience prior to this delivery?: Yes How long did the patient breastfeed?: Limited experience. attempted breastfeeding with 1st baby in hospital and baby would not latch so mom switched to formula.  Feeding Mother's Current Feeding Choice: Breast Milk   Baby observed breastfeeding well, lips flanged outward, audible swallows noted.  LATCH Score Latch: Grasps breast easily, tongue down, lips flanged, rhythmical sucking.  Audible Swallowing: Spontaneous and intermittent  Type of Nipple: Everted at rest and after stimulation  Comfort (Breast/Nipple): Soft / non-tender  Hold (Positioning): Assistance needed to correctly position infant at breast and maintain latch.  LATCH Score: 9  Interventions Interventions: Breast feeding basics reviewed;Breast massage;Breast compression;Education Reviewed what to expect in the first days when breastfeeding: 8-12 feeds in 24 hours with feeding cues, cluster feeding, how to know the baby is getting enough.  Consult Status Consult Status: Follow-up Date: 11/09/23 Follow-up type: In-patient  Update provided to care nurse.  Fuller Song 11/08/2023, 2:49 PM

## 2023-11-08 NOTE — Lactation Note (Signed)
This note was copied from a baby's chart. Lactation Consultation Note  Patient Name: Wendy Hunt RUEAV'W Date: 11/08/2023 Age:38 hours Reason for consult: Follow-up assessment;Term;Breastfeeding assistance   Maternal Data Has patient been taught Hand Expression?: Yes Does the patient have breastfeeding experience prior to this delivery?: Yes How long did the patient breastfeed?: Limited experience. attempted breastfeeding with 1st baby in hospital and baby would not latch so mom switched to formula.  Lactation called to room by RN to assist with a breastfeeding session.    Feeding Mother's Current Feeding Choice: Breast Milk  Upon entry into room infant at breast but not cueing.  Patient stated that she wasn't sure if infant was hunger or just pacifying.  Infant started cueing , and patient placed infant to the breast.  Infant bottom lip curled in and LC pulled infants lip down and lip flanged out.    After about 5 minutes, the feeding session went to pacifying.  Patient placed infant on chest to burp.  Infant burped immediately and did not cue anymore.    LATCH Score Latch: Grasps breast easily, tongue down, lips flanged, rhythmical sucking.  Audible Swallowing: Spontaneous and intermittent  Type of Nipple: Everted at rest and after stimulation  Comfort (Breast/Nipple): Soft / non-tender  Hold (Positioning): No assistance needed to correctly position infant at breast.  LATCH Score: 10  Interventions Interventions: Breast feeding basics reviewed;Assisted with latch;Breast massage;Breast compression;Position options;Education  During the feeding session, LC reviewed different positions and talked about infant behavior during the 1st 24hrs.  Patient verbalized understanding.  Reminded parents to follow infants feeding cue and feed infant at least 8x within a 24hr period but not to go over 3 hrs and arouse infant for a feeding.   Consult Status Consult Status:  Follow-up Date: 11/09/23 Follow-up type: In-patient    Wendy Hunt 11/08/2023, 4:20 PM

## 2023-11-08 NOTE — Lactation Note (Signed)
This note was copied from a baby's chart. Lactation Consultation Note  Patient Name: Wendy Hunt [id number] Date: 11/08/2023 Age:38 hours Reason for consult: Initial assessment;Term;RN request;Breastfeeding assistance   Maternal Data This is mom's 2nd baby, C/S elective repeat. Mom with history of anemia, transient elevated BP,  and AMA.  Requested by care nurse to assist mom with breastfeeding. Baby latched and fed well at 1st feed and has been sleepy since.  Has patient been taught Hand Expression?: No Does the patient have breastfeeding experience prior to this delivery?: Yes How long did the patient breastfeed?: Limited experience. attempted breastfeeding with 1st baby in hospital and baby would not latch so mom switched to formula.  Feeding Mother's Current Feeding Choice: Breast Milk Provided mom with tips and strategies to maximize position and latch techniques. Baby briefly awakened and latched a few seconds. Despite burping baby and arousing baby he would not latch and was pursing his lips refusing to latch. Put baby skin to skin with mom. Encouraged mom with any feeding cues to offer baby to breast. LC to return in 1 hour to assist mom with breastfeeding.  LATCH Score Latch: Too sleepy or reluctant, no latch achieved, no sucking elicited.  Audible Swallowing: None  Type of Nipple: Everted at rest and after stimulation  Comfort (Breast/Nipple): Soft / non-tender  Hold (Positioning): Assistance needed to correctly position infant at breast and maintain latch.  LATCH Score: 5   Interventions Interventions: Breast feeding basics reviewed;Assisted with latch;Skin to skin;Adjust position;Support pillows;Position options;Education  Consult Status Consult Status: Follow-up Date: 11/08/23 Follow-up type: In-patient  Update provided to care nurse  Fuller Song 11/08/2023, 1:44 PM

## 2023-11-08 NOTE — Progress Notes (Signed)
Postop Day  0  Subjective: 37 y.o. [id number] postpartum day #0 status post repeat cesarean section. She reports feeling like a short episode of chest pressure/tightness with SOB. Symptoms resolved quickly. She denies palpitations. Not currently having chest pain or SOB.  Reports a history of tachycardia and has established with cardiology.  States her pulse can intermittently be elevated in 120's but will usually settle to the 80's when she rests.    Objective: BP 118/80   Pulse (!) 125   Temp 98.8 F (37.1 C) (Oral)   Resp 20   Ht 4\' 11"  (1.499 m)   Wt 86.2 kg   LMP 01/07/2023 (Approximate)   SpO2 97%   Breastfeeding Unknown   BMI 38.38 kg/m    Physical Exam:  Physical Exam Cardiovascular:     Rate and Rhythm: Tachycardia present.     Pulses: Normal pulses.     Heart sounds: Normal heart sounds.  Pulmonary:     Effort: Pulmonary effort is normal.     Breath sounds: Normal breath sounds.  Musculoskeletal:        General: Normal range of motion.     Cervical back: Normal range of motion.  Skin:    General: Skin is warm and dry.  Neurological:     Mental Status: She is alert and oriented to person, place, and time.  Psychiatric:        Mood and Affect: Mood normal.      Recent Labs    11/06/23 1501  HGB 11.0*  HCT 33.7*  WBC 10.1  PLT 173    Assessment/Plan: 37 y.o. [id number] postpartum day # 0  Tachycardia  - Discussed concerns with Dr. Jean Rosenthal  - EKG, Chest X-ray, and CBC ordered  - Overall asymptomatic now - did report some dizziness when sitting on side of bed  - Verlyn states that this is similar to her pulse rate prior to pregnancy  - Discussed that we recommend evaluating for contributing causes  - Remains on telemonitoring     LOS: 0 days   Gustavo Lah, CNM 11/08/2023, 6:50 PM   ----- Margaretmary Eddy  Certified Nurse Midwife [facility]

## 2023-11-09 ENCOUNTER — Encounter: Payer: Self-pay | Admitting: Obstetrics and Gynecology

## 2023-11-09 LAB — CBC
HCT: 29.3 % — ABNORMAL LOW (ref 36.0–46.0)
Hemoglobin: 10 g/dL — ABNORMAL LOW (ref 12.0–15.0)
MCH: 28.3 pg (ref 26.0–34.0)
MCHC: 34.1 g/dL (ref 30.0–36.0)
MCV: 83 fL (ref 80.0–100.0)
Platelets: 191 10*3/uL (ref 150–400)
RBC: 3.53 MIL/uL — ABNORMAL LOW (ref 3.87–5.11)
RDW: 15.6 % — ABNORMAL HIGH (ref 11.5–15.5)
WBC: 14.5 10*3/uL — ABNORMAL HIGH (ref 4.0–10.5)
nRBC: 0 % (ref 0.0–0.2)

## 2023-11-09 MED ORDER — FERROUS SULFATE 325 (65 FE) MG PO TABS
325.0000 mg | ORAL_TABLET | Freq: Every day | ORAL | Status: DC
  Administered 2023-11-10: 325 mg via ORAL
  Filled 2023-11-09: qty 1

## 2023-11-09 MED ORDER — KETOROLAC TROMETHAMINE 30 MG/ML IJ SOLN: 30.0000 mg | Freq: Four times a day (QID) | INTRAMUSCULAR | Status: DC

## 2023-11-09 MED ORDER — DIPHENHYDRAMINE HCL 25 MG PO CAPS: 25.0000 mg | ORAL_CAPSULE | Freq: Four times a day (QID) | ORAL | Status: DC | PRN

## 2023-11-09 MED ORDER — ACETAMINOPHEN 500 MG PO TABS
1000.0000 mg | ORAL_TABLET | Freq: Four times a day (QID) | ORAL | Status: DC
  Administered 2023-11-09 – 2023-11-10 (×4): 1000 mg via ORAL
  Filled 2023-11-09 (×4): qty 2

## 2023-11-09 MED ORDER — IBUPROFEN 600 MG PO TABS
600.0000 mg | ORAL_TABLET | Freq: Four times a day (QID) | ORAL | Status: DC
  Administered 2023-11-09 – 2023-11-10 (×3): 600 mg via ORAL
  Filled 2023-11-09 (×3): qty 1

## 2023-11-09 MED ORDER — OXYCODONE HCL 5 MG PO TABS: 5.0000 mg | ORAL_TABLET | ORAL | Status: DC | PRN

## 2023-11-09 MED ORDER — IBUPROFEN 600 MG PO TABS: 600.0000 mg | ORAL_TABLET | Freq: Four times a day (QID) | ORAL | Status: DC

## 2023-11-09 NOTE — Anesthesia Post-op Follow-up Note (Signed)
  Anesthesia Pain Follow-up Note  Patient: Wendy Hunt  Day #: 1  Date of Follow-up: 11/09/2023 Time: 7:38 AM  Last Vitals:  Vitals:   11/09/23 0146 11/09/23 0414  BP: 114/78 117/85  Pulse: (!) 126 (!) 118  Resp: 20 20  Temp: 37.6 C 37 C  SpO2: 97% 98%    Level of Consciousness: alert  Pain: none   Side Effects:None  Catheter Site Exam:clean, dry, no drainage  Anti-Coag Meds (From admission, onward)    Start     Dose/Rate Route Frequency Ordered Stop   11/09/23 0800  enoxaparin (LOVENOX) injection 40 mg        40 mg Subcutaneous Every 24 hours 11/08/23 1146          Plan: D/C from anesthesia care at surgeon's request  Rosanne Gutting

## 2023-11-09 NOTE — Progress Notes (Signed)
Post Partum Day 1  Subjective: Doing well, no concerns. Ambulating without difficulty, pain managed with PO meds, tolerating regular diet, and voiding without difficulty.   No fever/chills, chest pain, shortness of breath, nausea/vomiting, or leg pain. No nipple or breast pain. No headache, visual changes, or RUQ/epigastric pain.  Objective: BP 121/79 (BP Location: Left Arm)   Pulse (!) 115 Comment: nurse Shelaine notified  Temp 98.3 F (36.8 C) (Oral)   Resp 20   Ht 4\' 11"  (1.499 m)   Wt 86.2 kg   LMP 01/07/2023 (Approximate)   SpO2 98% Comment: Room Air  Breastfeeding Unknown   BMI 38.38 kg/m    Physical Exam:  General: alert and cooperative Breasts: soft/nontender CV: RRR Pulm: nl effort Abdomen: soft, non-tender Uterine Fundus: firm Incision: healing well Perineum: intact Lochia: appropriate DVT Evaluation: No evidence of DVT seen on physical exam. Edinburgh:     11/08/2023    8:30 PM  Edinburgh Postnatal Depression Scale Screening Tool  I have been able to laugh and see the funny side of things. 0  I have looked forward with enjoyment to things. 0  I have blamed myself unnecessarily when things went wrong. 1  I have been anxious or worried for no good reason. 0  I have felt scared or panicky for no good reason. 0  Things have been getting on top of me. 0  I have been so unhappy that I have had difficulty sleeping. 0  I have felt sad or miserable. 0  I have been so unhappy that I have been crying. 0  The thought of harming myself has occurred to me. 0  Edinburgh Postnatal Depression Scale Total 1     Recent Labs    11/08/23 2001 11/09/23 0630  HGB 9.2* 10.0*  HCT 27.4* 29.3*  WBC 10.3 14.5*  PLT 161 191    Assessment/Plan: 37 y.o. [id number] postpartum day # 1  1. Continue routine postpartum care  2. Infant feeding status: breast feeding -Lactation consult PRN for breastfeeding   3. Contraception plan:  non-hormonal options  4. Acute blood loss  anemia - clinically significant.  -Hemodynamically stable and asymptomatic -Intervention: start on oral supplementation with ferrous sulfate 325 mg   5. Immunization status:   needs MMR prior to discharge   6. History of Sinus Tachycardia - Chest x-ray, EKG and LE Ven Doppler completed 11/08/23 and all normal  Disposition: Continue inpatient postpartum care    LOS: 1 day   Gowri Suchan, CNM 11/09/2023, 10:07 AM

## 2023-11-09 NOTE — Anesthesia Postprocedure Evaluation (Signed)
Anesthesia Post Note  Patient: Wendy Hunt  Procedure(s) Performed: REPEAT CESAREAN SECTION  Patient location during evaluation: Mother Baby Anesthesia Type: Spinal Level of consciousness: oriented and awake and alert Pain management: pain level controlled Vital Signs Assessment: post-procedure vital signs reviewed and stable Respiratory status: spontaneous breathing and respiratory function stable Cardiovascular status: blood pressure returned to baseline and stable Postop Assessment: no headache, no backache, no apparent nausea or vomiting and able to ambulate Anesthetic complications: no   No notable events documented.   Last Vitals:  Vitals:   11/09/23 0146 11/09/23 0414  BP: 114/78 117/85  Pulse: (!) 126 (!) 118  Resp: 20 20  Temp: 37.6 C 37 C  SpO2: 97% 98%    Last Pain:  Vitals:   11/09/23 0600  TempSrc:   PainSc: 4                  Rosanne Gutting

## 2023-11-09 NOTE — Lactation Note (Signed)
This note was copied from a baby's chart. Lactation Consultation Note  Patient Name: Wendy Hunt [id number] Date: 11/09/2023 Age:38 hours Reason for consult: Follow-up assessment;Term   Maternal Data This is mom's 2nd baby, C/S elective repeat. Mom with history of anemia, transient elevated BP, and AMA.   On follow-up today mom reports baby has been breastfeeding, voiding and making stool diapers.Baby has had 3 stools since 7:30 am today. Mom's right nipple has a compression strips on it with a small pinpointed scab. Mom is applying coconut oil. Reviewed with mom a good latch. Per mom baby has been tucking in his bottom lip. Mom with some concerns about baby having acrocyanosis and requested to see Pediatrician. Updated care nurse and Dr.Coyner about mom's concerns.  Has patient been taught Hand Expression?: Yes Does the patient have breastfeeding experience prior to this delivery?: Yes How long did the patient breastfeed?: Limited experience. attempted breastfeeding with 1st baby in hospital and baby would not latch so mom switched to formula.  Feeding Mother's Current Feeding Choice: Breast Milk  Provided mom with tips and strategies to maximize position and latch technique to minimize and decrease discomfort with latch on. Recommended mom consider using football hold a few breastfeeding sessions. Mom has been using cradle hold. Mom understands to ensure bottom lip is rolled outward on to areola. Mom has LC number on white board and will call LC as needed.   Interventions Interventions: Breast feeding basics reviewed;Position options;Coconut oil;Education  Discharge Discharge Education: Engorgement and breast care;Warning signs for feeding baby;Outpatient recommendation Pump: Personal;Manual (Mom has 3 pumps, 1 DEBP. 1 manual, and a Haaka)  Consult Status Consult Status: PRN Date: 11/09/23 Follow-up type: In-patient  Update provided to care nurse.  Fuller Song 11/09/2023, 12:06 PM

## 2023-11-10 MED ORDER — IBUPROFEN 600 MG PO TABS
600.0000 mg | ORAL_TABLET | Freq: Four times a day (QID) | ORAL | Status: DC
  Administered 2023-11-10 (×2): 600 mg via ORAL
  Filled 2023-11-10 (×2): qty 1

## 2023-11-10 MED ORDER — OXYCODONE HCL 5 MG PO TABS: 5.0000 mg | ORAL_TABLET | ORAL | 0 refills | Status: DC | PRN

## 2023-11-10 MED ORDER — DIBUCAINE (PERIANAL) 1 % EX OINT: 1.0000 | TOPICAL_OINTMENT | CUTANEOUS | Status: DC | PRN

## 2023-11-10 MED ORDER — IBUPROFEN 800 MG PO TABS: 800.0000 mg | ORAL_TABLET | Freq: Three times a day (TID) | ORAL | 0 refills | Status: DC | PRN

## 2023-11-10 MED ORDER — ACETAMINOPHEN 500 MG PO TABS: 1000.0000 mg | ORAL_TABLET | Freq: Four times a day (QID) | ORAL | 0 refills | Status: DC

## 2023-11-10 MED ORDER — SENNOSIDES-DOCUSATE SODIUM 8.6-50 MG PO TABS: 2.0000 | ORAL_TABLET | Freq: Every day | ORAL | Status: DC

## 2023-11-10 MED ORDER — FERROUS SULFATE 325 (65 FE) MG PO TABS: 325.0000 mg | ORAL_TABLET | Freq: Every day | ORAL | Status: DC

## 2023-11-10 MED ORDER — SIMETHICONE 80 MG PO CHEW: 80.0000 mg | CHEWABLE_TABLET | ORAL | 0 refills | Status: DC | PRN

## 2023-11-10 MED ORDER — COCONUT OIL OIL: 1.0000 | TOPICAL_OIL | Status: DC | PRN

## 2023-11-10 MED ORDER — WITCH HAZEL-GLYCERIN EX PADS: 1.0000 | MEDICATED_PAD | CUTANEOUS | Status: DC | PRN

## 2023-11-10 NOTE — Discharge Instructions (Signed)
Call office if you have any of the following:  -Persistent headache or visual changes (possible high blood pressure) -Fever >101.0 F or chills (possible infection) -Breast concerns (engorgement, mastitis) -Excessive vaginal bleeding (soaking through more than one pad in 1 hr x 2 hr) -Incision drainage/redness/increased pain/warmth at site (possible infection)  -Leg pain or redness (possible blood clot) -Depression/anxiety increased symptoms 2 weeks after delivery  Activity & Hygiene: -Do not lift > 10 lbs for 6 weeks.  -No intercourse or tampons for 6 weeks.  -No swimming pools, hot tubs or tub baths- showers only for 6 weeks **No driving for 1-2 weeks or while taking pain medication after c-section  -It is normal to bleed for up to 6 weeks. You should not soak through more than 1 pad in 1 hour x 2 hours.  Breastfeeding: -Continue prenatal vitamin.  -Increase calories and fluids.  -Your milk will come in, in the next couple of days (right now it is colostrum).  -You may have a slight fever when your milk comes in, but it should go away on its own.   -If it does not, and rises above 101 F please call the doctor.  -You will also feel achy and your breasts will be firm. They will also start to leak.  *For breastfeeding concerns, the lactation consultant can be reached at [phone]  Not Breastfeeding: -Avoid breast stimulation and wear supportive bra -ICE helps decrease inflammation and pain -Express milk for comfort by hand, do not empty breast  Postpartum blues: -feelings of happy one minute and sad another minute are normal for the first few weeks. -if it gets worse please let your doctor know. It is very common!!

## 2023-11-10 NOTE — Progress Notes (Signed)
Patient discharged home with family.  Discharge instructions, when to follow up, and medications reviewed with patient.  Patient verbalized understanding. Patient will be escorted out by staff.

## 2024-04-22 ENCOUNTER — Encounter: Payer: Self-pay | Admitting: Neurology

## 2024-04-22 ENCOUNTER — Other Ambulatory Visit: Payer: Self-pay | Admitting: Neurology

## 2024-04-22 DIAGNOSIS — R519 Headache, unspecified: Secondary | ICD-10-CM

## 2024-05-03 ENCOUNTER — Ambulatory Visit
Admission: RE | Admit: 2024-05-03 | Discharge: 2024-05-03 | Disposition: A | Discharge status: Home / Self Care | Source: Ambulatory Visit | Attending: Neurology | Admitting: Neurology

## 2024-05-03 DIAGNOSIS — R519 Headache, unspecified: Secondary | ICD-10-CM

## 2024-06-27 ENCOUNTER — Encounter: Payer: Self-pay | Admitting: Neurology

## 2024-07-31 ENCOUNTER — Other Ambulatory Visit

## 2024-07-31 ENCOUNTER — Ambulatory Visit
Admission: RE | Admit: 2024-07-31 | Discharge: 2024-07-31 | Disposition: A | Discharge status: Home / Self Care | Source: Ambulatory Visit | Attending: Neurology | Admitting: Neurology

## 2024-07-31 DIAGNOSIS — G5602 Carpal tunnel syndrome, left upper limb: Secondary | ICD-10-CM | POA: Insufficient documentation

## 2024-07-31 DIAGNOSIS — R519 Headache, unspecified: Secondary | ICD-10-CM

## 2024-08-23 ENCOUNTER — Ambulatory Visit: Admitting: Neurosurgery

## 2024-08-23 VITALS — BP 119/80 | HR 76 | Ht 59.0 in | Wt 147.0 lb

## 2024-08-23 DIAGNOSIS — E348 Other specified endocrine disorders: Secondary | ICD-10-CM

## 2024-08-23 NOTE — Progress Notes (Signed)
 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  Over the past few months he started having dizziness spells and they would come and go during the day.  She talk to her primary care doctor and was referred to the [facility] clinic where she was evaluated.  She felt that her symptoms were not properly addressed and she was told that she had a headache disorder when actually she feels that she did not.  She was offered a nerve block but she did not proceed with this because she did not feel that she had gone for headache disorder but for dizziness.  She was also recommended a sleep study and she put that on hold because of her experience.  By the time that she had seen the neurology team, her dizziness had gotten better but shortly thereafter, she had a very bad episode of dizziness when she was at work but she also started having memory problems.  An MRI of the brain was then done and this showed an 8 mm pineal cyst and patient was referred to us .  She says that she continues to have memory problems but dizziness is not as prominent right now.  Patient is also a mother of an 67 year old child and is married.  Plan : I do lengthy conversation with her about the pineal cyst and I told her that it is a completely incidental finding and unrelated to any of her symptoms.  I told her that only time the pineal region masses are of relevance has been they have growing tumors with compression of the tectum resulting in diplopia, headaches nausea and vomiting and hydrocephalus.  She definitely does not fit that bill and more likely than not this is something that she has had for a very long period of time and I am not too worried about it.  If she does start having the symptoms, she is going to call me back.  Otherwise, we decided to get a repeat MRI of the brain without contrast in 1 year.   Social History   Socioeconomic History    Marital status: Married    Spouse name: Bard   Number of children: 1   Years of education: [employer] education level: Not on file  Occupational History    Comment: Full-time    Employer: [employer]  Tobacco Use   Smoking status: Former    Average packs/day: 0.5 packs/day for 8.0 years (4.0 ttl pk-yrs)    Types: Cigarettes    Start date: 12/19/2003    Quit date: 12/19/2011    Years since quitting: 12.6   Smokeless tobacco: Never  Vaping Use   Vaping status: Never Used  Substance and Sexual Activity   Alcohol use: Not Currently    Comment: SOCIAL   Drug use: No   Sexual activity: Never  Other Topics Concern   Not on file  Social History Narrative   Not on file   Social Drivers of Health   Financial Resource Strain: Low Risk  (07/10/2024)   Received from [facility]   Overall Financial Resource Strain (CARDIA)    Difficulty of Paying Living Expenses: Not hard at all  Food Insecurity: No Food Insecurity (07/10/2024)   Received from [facility]   Hunger Vital Sign    Within the past 12 months, you worried that your food would run out before you got the  money to buy more.: Never true    Within the past 12 months, the food you bought just didn't last and you didn't have money to get more.: Never true  Transportation Needs: No Transportation Needs (07/10/2024)   Received from [facility] - Transportation    In the past 12 months, has lack of transportation kept you from medical appointments or from getting medications?: No    Lack of Transportation (Non-Medical): No  Physical Activity: Not on file  Stress: Not on file  Social Connections: Not on file  Intimate Partner Violence: Not At Risk (11/08/2023)   Humiliation, Afraid, Rape, and Kick questionnaire    Fear of Current or Ex-Partner: No    Emotionally Abused: No    Physically Abused: No    Sexually Abused: No    Family History  Problem  Relation Age of Onset   Healthy Mother    Hyperlipidemia Mother    Healthy Father        Pre-DM and hypochondriac   Diabetes Maternal Grandfather    Diabetes Paternal Grandmother    Breast cancer Paternal Grandmother    Esophageal cancer Paternal Grandfather    Diabetes Other    Glaucoma Other    Breast cancer Other    Angina Maternal Grandmother     Allergies  Allergen Reactions   Epinephrine Palpitations   Sulfa Antibiotics Rash    Past Medical History:  Diagnosis Date   Amenorrhea    Anemia    Excess, menstruation    Palpitations    RAD (reactive airway disease) with wheezing    Tachycardia    Transient elevated blood pressure    Vaginal bleeding in pregnancy     Past Surgical History:  Procedure Laterality Date   CESAREAN SECTION  November 2013   CESAREAN SECTION N/A 11/08/2023   Procedure: REPEAT CESAREAN SECTION;  Surgeon: Lovetta, Debby PARAS, MD;  Location: [facility] ORS;  Service: Obstetrics;  Laterality: N/A;   HYSTEROSCOPY WITH D & C  12/09/2019   Procedure: DILATATION AND CURETTAGE /HYSTEROSCOPY;  Surgeon: Schermerhorn, Debby PARAS, MD;  Location: [facility] ORS;  Service: Gynecology;;   WISDOM TOOTH EXTRACTION  2003     Physical Exam HENT:     Head: Normocephalic.     Nose: Nose normal.  Eyes:     Pupils: Pupils are equal, round, and reactive to light.  Cardiovascular:     Rate and Rhythm: Normal rate.  Pulmonary:     Effort: Pulmonary effort is normal.  Abdominal:     General: Abdomen is flat.  Musculoskeletal:     Cervical back: Normal range of motion.  Neurological:     Mental Status: She is alert.     Cranial Nerves: Cranial nerves 2-12 are intact.     Sensory: Sensation is intact.     Motor: Motor function is intact.     Coordination: Coordination is intact.        Results for orders placed or performed during the hospital encounter of 07/31/24  MR BRAIN WO CONTRAST   Narrative   CLINICAL DATA:  Provided history: Headache  disorder.  EXAM: MRI HEAD WITHOUT CONTRAST  TECHNIQUE: Multiplanar, multiecho pulse sequences of the brain and surrounding structures were obtained without intravenous contrast.  COMPARISON:  Head CT 09/22/2009.  FINDINGS: Brain:  No age-advanced or lobar predominant cerebral atrophy.  8 mm pineal cyst.  No cortical encephalomalacia is identified. No significant cerebral white matter disease.  There is no acute  infarct.  No evidence of an intracranial mass.  No chronic intracranial blood products.  No extra-axial fluid collection.  No midline shift.  Vascular: Maintained flow voids within the proximal large arterial vessels.  Skull and upper cervical spine: No focal worrisome marrow lesion.  Sinuses/Orbits: No mass or acute finding within the imaged orbits. No significant paranasal sinus disease.  Other: Trace fluid within right mastoid air cells.  IMPRESSION: 1.  No evidence of an acute intracranial abnormality. 2. 8 mm pineal cyst. 3. Otherwise unremarkable non-contrast MRI appearance of the brain. 4. Trace right mastoid effusion.   Electronically Signed   By: Rockey Childs D.O.   On: 08/08/2024 08:44

## 2024-09-03 ENCOUNTER — Other Ambulatory Visit: Payer: Self-pay | Admitting: Surgery

## 2024-09-06 ENCOUNTER — Encounter
Admission: RE | Admit: 2024-09-06 | Discharge: 2024-09-06 | Disposition: A | Discharge status: Home / Self Care | Source: Ambulatory Visit | Attending: Surgery | Admitting: Surgery

## 2024-09-06 ENCOUNTER — Other Ambulatory Visit: Payer: Self-pay

## 2024-09-06 DIAGNOSIS — Z01818 Encounter for other preprocedural examination: Secondary | ICD-10-CM

## 2024-09-06 DIAGNOSIS — G5602 Carpal tunnel syndrome, left upper limb: Secondary | ICD-10-CM

## 2024-09-06 DIAGNOSIS — R000 Tachycardia, unspecified: Secondary | ICD-10-CM

## 2024-09-06 DIAGNOSIS — Z01812 Encounter for preprocedural laboratory examination: Secondary | ICD-10-CM

## 2024-09-06 DIAGNOSIS — D62 Acute posthemorrhagic anemia: Secondary | ICD-10-CM

## 2024-09-06 DIAGNOSIS — Z01810 Encounter for preprocedural cardiovascular examination: Secondary | ICD-10-CM

## 2024-09-06 HISTORY — DX: Unspecified hemorrhoids: K64.9

## 2024-09-06 HISTORY — DX: Personal history of nicotine dependence: Z87.891

## 2024-09-06 HISTORY — DX: Iron deficiency anemia, unspecified: D50.9

## 2024-09-06 HISTORY — DX: Carpal tunnel syndrome, left upper limb: G56.02

## 2024-09-06 HISTORY — DX: Other specified endocrine disorders: E34.8

## 2024-09-06 NOTE — Patient Instructions (Signed)
 Your procedure is scheduled on:09-18-24 Wednesday Report to the Registration Desk on the 1st floor of the [facility].Then proceed to the 2nd floor Surgery Desk To find out your arrival time, please call [phone] between 1PM - 3PM on:09-17-24 Tuesday If your arrival time is 6:00 am, do not arrive before that time as the [facility] entrance doors do not open until 6:00 am.  REMEMBER: Instructions that are not followed completely may result in serious medical risk, up to and including death; or upon the discretion of your surgeon and anesthesiologist your surgery may need to be rescheduled.  Do not eat food after midnight the night before surgery.  No gum chewing or hard candies.  You may however, drink CLEAR liquids up to 2 hours before you are scheduled to arrive for your surgery. Do not drink anything within 2 hours of your scheduled arrival time.  Clear liquids include: - water  - apple juice without pulp - gatorade (not RED colors) - black coffee or tea (Do NOT add milk or creamers to the coffee or tea) Do NOT drink anything that is not on this list.  In addition, your doctor has ordered for you to drink the provided:  Ensure Pre-Surgery Clear Carbohydrate Drink  Drinking this carbohydrate drink up to two hours before surgery helps to reduce insulin resistance and improve patient outcomes. Please complete drinking 2 hours before scheduled arrival time.  One week prior to surgery:Last dose will be on 09-10-24 Tuesday Stop Anti-inflammatories (NSAIDS) such as Advil , Aleve, Ibuprofen , Motrin , Naproxen, Naprosyn and Aspirin based products such as Excedrin, Goody's Powder, BC Powder. Stop ANY OVER THE COUNTER supplements until after surgery (Magnesium Oxide, Prenatal vitamin)  You may however, continue to take Tylenol  if needed for pain up until the day of surgery.  Continue taking all of your other prescription medications up until the day of surgery.  Do NOT take any medication  the day of surgery   Bring your Albuterol  Inhaler to the hospital  No Alcohol for 24 hours before or after surgery.  No Smoking including e-cigarettes for 24 hours before surgery.  No chewable tobacco products for at least 6 hours before surgery.  No nicotine patches on the day of surgery.  Do not use any recreational drugs for at least a week (preferably 2 weeks) before your surgery.  Please be advised that the combination of cocaine and anesthesia may have negative outcomes, up to and including death. If you test positive for cocaine, your surgery will be cancelled.  On the morning of surgery brush your teeth with toothpaste and water, you may rinse your mouth with mouthwash if you wish. Do not swallow any toothpaste or mouthwash.  Use CHG Soap as directed on instruction sheet.  Do not wear jewelry, make-up, hairpins, clips or nail polish.  For welded (permanent) jewelry: bracelets, anklets, waist bands, etc.  Please have this removed prior to surgery.  If it is not removed, there is a chance that hospital personnel will need to cut it off on the day of surgery.  Do not wear lotions, powders, or perfumes.   Do not shave body hair from the neck down 48 hours before surgery.  Contact lenses, hearing aids and dentures may not be worn into surgery.  Do not bring valuables to the hospital. [facility] is not responsible for any missing/lost belongings or valuables.   Notify your doctor if there is any change in your medical condition (cold, fever, infection).  Wear comfortable  clothing (specific to your surgery type) to the hospital.  After surgery, you can help prevent lung complications by doing breathing exercises.  Take deep breaths and cough every 1-2 hours. Your doctor may order a device called an Incentive Spirometer to help you take deep breaths. When coughing or sneezing, hold a pillow firmly against your incision with both hands. This is called "splinting." Doing this  helps protect your incision. It also decreases belly discomfort.  If you are being admitted to the hospital overnight, leave your suitcase in the car. After surgery it may be brought to your room.  In case of increased patient census, it may be necessary for you, the patient, to continue your postoperative care in the Same Day Surgery department.  If you are being discharged the day of surgery, you will not be allowed to drive home. You will need a responsible individual to drive you home and stay with you for 24 hours after surgery.   If you are taking public transportation, you will need to have a responsible individual with you.  Please call the Pre-admissions Testing Dept. at [phone] if you have any questions about these instructions.  Surgery Visitation Policy:  Patients having surgery or a procedure may have two visitors.  Children under the age of 70 must have an adult with them who is not the patient.                                                                                                             Preparing for Surgery with CHLORHEXIDINE  GLUCONATE (CHG) Soap  Chlorhexidine  Gluconate (CHG) Soap  o An antiseptic cleaner that kills germs and bonds with the skin to continue killing germs even after washing  o Used for showering the night before surgery and morning of surgery  Before surgery, you can play an important role by reducing the number of germs on your skin.  CHG (Chlorhexidine  gluconate) soap is an antiseptic cleanser which kills germs and bonds with the skin to continue killing germs even after washing.  Please do not use if you have an allergy to CHG or antibacterial soaps. If your skin becomes reddened/irritated stop using the CHG.  1. Shower the NIGHT BEFORE SURGERY and the MORNING OF SURGERY with CHG soap.  2. If you choose to wash your hair, wash your hair first as usual with your normal shampoo.  3. After shampooing, rinse your hair and body  thoroughly to remove the shampoo.  4. Use CHG as you would any other liquid soap. You can apply CHG directly to the skin and wash gently with a scrungie or a clean washcloth.  5. Apply the CHG soap to your body only from the neck down. Do not use on open wounds or open sores. Avoid contact with your eyes, ears, mouth, and genitals (private parts). Wash face and genitals (private parts) with your normal soap.  6. Wash thoroughly, paying special attention to the area where your surgery will be performed.  7. Thoroughly rinse your body with warm  water.  8. Do not shower/wash with your normal soap after using and rinsing off the CHG soap.  9. Pat yourself dry with a clean towel.  10. Wear clean pajamas to bed the night before surgery.  12. Place clean sheets on your bed the night of your first shower and do not sleep with pets.  13. Shower again with the CHG soap on the day of surgery prior to arriving at the hospital.  14. Do not apply any deodorants/lotions/powders.  15. Please wear clean clothes to the hospital.   [employer] to address health-related social needs:  [email or web address]

## 2024-09-11 ENCOUNTER — Other Ambulatory Visit: Payer: Self-pay | Admitting: Surgery

## 2024-09-11 ENCOUNTER — Encounter
Admission: RE | Admit: 2024-09-11 | Discharge: 2024-09-11 | Disposition: A | Discharge status: Home / Self Care | Source: Ambulatory Visit | Attending: Surgery | Admitting: Surgery

## 2024-09-11 DIAGNOSIS — R000 Tachycardia, unspecified: Secondary | ICD-10-CM | POA: Diagnosis not present

## 2024-09-11 DIAGNOSIS — D62 Acute posthemorrhagic anemia: Secondary | ICD-10-CM | POA: Diagnosis not present

## 2024-09-11 DIAGNOSIS — Z01810 Encounter for preprocedural cardiovascular examination: Secondary | ICD-10-CM

## 2024-09-11 DIAGNOSIS — I498 Other specified cardiac arrhythmias: Secondary | ICD-10-CM | POA: Insufficient documentation

## 2024-09-11 DIAGNOSIS — Z01812 Encounter for preprocedural laboratory examination: Secondary | ICD-10-CM | POA: Diagnosis present

## 2024-09-11 LAB — CBC
HCT: 38.9 % (ref 36.0–46.0)
Hemoglobin: 12.9 g/dL (ref 12.0–15.0)
MCH: 28.3 pg (ref 26.0–34.0)
MCHC: 33.2 g/dL (ref 30.0–36.0)
MCV: 85.3 fL (ref 80.0–100.0)
Platelets: 248 K/uL (ref 150–400)
RBC: 4.56 MIL/uL (ref 3.87–5.11)
RDW: 13.2 % (ref 11.5–15.5)
WBC: 8.1 K/uL (ref 4.0–10.5)
nRBC: 0 % (ref 0.0–0.2)

## 2024-09-12 DIAGNOSIS — Z01810 Encounter for preprocedural cardiovascular examination: Secondary | ICD-10-CM | POA: Diagnosis not present

## 2024-09-18 ENCOUNTER — Ambulatory Visit
Admission: RE | Admit: 2024-09-18 | Discharge: 2024-09-18 | Disposition: A | Discharge status: Home / Self Care | Source: Ambulatory Visit | Attending: Surgery | Admitting: Surgery

## 2024-09-18 ENCOUNTER — Other Ambulatory Visit: Payer: Self-pay

## 2024-09-18 ENCOUNTER — Ambulatory Visit: Payer: Self-pay | Admitting: Registered Nurse

## 2024-09-18 ENCOUNTER — Encounter
Admission: RE | Disposition: A | Discharge status: Home / Self Care | Payer: Self-pay | Source: Ambulatory Visit | Attending: Surgery

## 2024-09-18 ENCOUNTER — Ambulatory Visit: Payer: Self-pay | Admitting: Urgent Care

## 2024-09-18 ENCOUNTER — Encounter: Payer: Self-pay | Admitting: Surgery

## 2024-09-18 DIAGNOSIS — G5601 Carpal tunnel syndrome, right upper limb: Secondary | ICD-10-CM | POA: Insufficient documentation

## 2024-09-18 DIAGNOSIS — Z87891 Personal history of nicotine dependence: Secondary | ICD-10-CM | POA: Insufficient documentation

## 2024-09-18 DIAGNOSIS — Z01818 Encounter for other preprocedural examination: Secondary | ICD-10-CM

## 2024-09-18 DIAGNOSIS — J45909 Unspecified asthma, uncomplicated: Secondary | ICD-10-CM | POA: Diagnosis not present

## 2024-09-18 HISTORY — PX: CARPAL TUNNEL RELEASE: SHX101

## 2024-09-18 LAB — POCT PREGNANCY, URINE: Preg Test, Ur: NEGATIVE

## 2024-09-18 SURGERY — RELEASE, CARPAL TUNNEL, ENDOSCOPIC
Anesthesia: General | Site: Wrist | Laterality: Right

## 2024-09-18 MED ORDER — CHLORHEXIDINE GLUCONATE 0.12 % MT SOLN
OROMUCOSAL | Status: AC
  Filled 2024-09-18: qty 15

## 2024-09-18 MED ORDER — KETOROLAC TROMETHAMINE 30 MG/ML IJ SOLN
30.0000 mg | Freq: Once | INTRAMUSCULAR | Status: AC
  Administered 2024-09-18: 30 mg via INTRAVENOUS

## 2024-09-18 MED ORDER — PROPOFOL 1000 MG/100ML IV EMUL
INTRAVENOUS | Status: AC
  Filled 2024-09-18: qty 100

## 2024-09-18 MED ORDER — CEFAZOLIN SODIUM-DEXTROSE 2-4 GM/100ML-% IV SOLN
INTRAVENOUS | Status: AC
  Filled 2024-09-18: qty 100

## 2024-09-18 MED ORDER — CEFAZOLIN SODIUM-DEXTROSE 2-4 GM/100ML-% IV SOLN
2.0000 g | INTRAVENOUS | Status: AC
  Administered 2024-09-18: 2 g via INTRAVENOUS

## 2024-09-18 MED ORDER — ONDANSETRON HCL 4 MG PO TABS: 4.0000 mg | ORAL_TABLET | Freq: Four times a day (QID) | ORAL | Status: DC | PRN

## 2024-09-18 MED ORDER — FENTANYL CITRATE (PF) 100 MCG/2ML IJ SOLN
INTRAMUSCULAR | Status: AC
  Filled 2024-09-18: qty 2

## 2024-09-18 MED ORDER — MIDAZOLAM HCL 2 MG/2ML IJ SOLN
INTRAMUSCULAR | Status: DC | PRN
  Administered 2024-09-18: 2 mg via INTRAVENOUS

## 2024-09-18 MED ORDER — 0.9 % SODIUM CHLORIDE (POUR BTL) OPTIME
TOPICAL | Status: DC | PRN
  Administered 2024-09-18: 500 mL

## 2024-09-18 MED ORDER — LACTATED RINGERS IV SOLN: INTRAVENOUS | Status: DC

## 2024-09-18 MED ORDER — ACETAMINOPHEN 325 MG PO TABS: 325.0000 mg | ORAL_TABLET | Freq: Four times a day (QID) | ORAL | Status: DC | PRN

## 2024-09-18 MED ORDER — ONDANSETRON HCL 4 MG/2ML IJ SOLN: 4.0000 mg | Freq: Four times a day (QID) | INTRAMUSCULAR | Status: DC | PRN

## 2024-09-18 MED ORDER — OXYCODONE HCL 5 MG PO TABS
ORAL_TABLET | ORAL | Status: AC
  Filled 2024-09-18: qty 1

## 2024-09-18 MED ORDER — BUPIVACAINE HCL (PF) 0.5 % IJ SOLN
INTRAMUSCULAR | Status: DC | PRN
  Administered 2024-09-18: 10 mL

## 2024-09-18 MED ORDER — METOCLOPRAMIDE HCL 10 MG PO TABS: 5.0000 mg | ORAL_TABLET | Freq: Three times a day (TID) | ORAL | Status: DC | PRN

## 2024-09-18 MED ORDER — PHENYLEPHRINE HCL (PRESSORS) 10 MG/ML IV SOLN
INTRAVENOUS | Status: AC
Start: 2024-09-18 — End: 2024-09-18
  Filled 2024-09-18: qty 1

## 2024-09-18 MED ORDER — FENTANYL CITRATE (PF) 100 MCG/2ML IJ SOLN: 25.0000 ug | INTRAMUSCULAR | Status: DC | PRN

## 2024-09-18 MED ORDER — PROPOFOL 10 MG/ML IV BOLUS
INTRAVENOUS | Status: DC | PRN
  Administered 2024-09-18: 200 mg via INTRAVENOUS

## 2024-09-18 MED ORDER — DROPERIDOL 2.5 MG/ML IJ SOLN: 0.6250 mg | Freq: Once | INTRAMUSCULAR | Status: DC | PRN

## 2024-09-18 MED ORDER — SODIUM CHLORIDE 0.9 % IV SOLN: INTRAVENOUS | Status: DC

## 2024-09-18 MED ORDER — PHENYLEPHRINE HCL-NACL 20-0.9 MG/250ML-% IV SOLN
INTRAVENOUS | Status: AC
  Filled 2024-09-18: qty 250

## 2024-09-18 MED ORDER — DEXAMETHASONE SODIUM PHOSPHATE 10 MG/ML IJ SOLN
INTRAMUSCULAR | Status: DC | PRN
  Administered 2024-09-18: 5 mg via INTRAVENOUS

## 2024-09-18 MED ORDER — FENTANYL CITRATE (PF) 100 MCG/2ML IJ SOLN
INTRAMUSCULAR | Status: DC | PRN
  Administered 2024-09-18: 50 ug via INTRAVENOUS
  Administered 2024-09-18: 25 ug via INTRAVENOUS

## 2024-09-18 MED ORDER — CHLORHEXIDINE GLUCONATE 0.12 % MT SOLN
15.0000 mL | Freq: Once | OROMUCOSAL | Status: AC
  Administered 2024-09-18: 15 mL via OROMUCOSAL

## 2024-09-18 MED ORDER — MIDAZOLAM HCL 2 MG/2ML IJ SOLN
INTRAMUSCULAR | Status: AC
  Filled 2024-09-18: qty 2

## 2024-09-18 MED ORDER — ONDANSETRON HCL 4 MG/2ML IJ SOLN
INTRAMUSCULAR | Status: DC | PRN
  Administered 2024-09-18: 4 mg via INTRAVENOUS

## 2024-09-18 MED ORDER — PROPOFOL 500 MG/50ML IV EMUL
INTRAVENOUS | Status: DC | PRN
  Administered 2024-09-18: 150 ug/kg/min via INTRAVENOUS

## 2024-09-18 MED ORDER — LIDOCAINE HCL (CARDIAC) PF 100 MG/5ML IV SOSY
PREFILLED_SYRINGE | INTRAVENOUS | Status: DC | PRN
Start: 2024-09-18 — End: 2024-09-18
  Administered 2024-09-18: 60 mg via INTRAVENOUS

## 2024-09-18 MED ORDER — LACTATED RINGERS IV SOLN: INTRAVENOUS | Status: DC | PRN

## 2024-09-18 MED ORDER — OXYCODONE HCL 5 MG PO TABS
5.0000 mg | ORAL_TABLET | Freq: Once | ORAL | Status: AC
  Administered 2024-09-18: 5 mg via ORAL

## 2024-09-18 MED ORDER — BUPIVACAINE HCL (PF) 0.5 % IJ SOLN
INTRAMUSCULAR | Status: AC
  Filled 2024-09-18: qty 30

## 2024-09-18 MED ORDER — METOCLOPRAMIDE HCL 5 MG/ML IJ SOLN: 5.0000 mg | Freq: Three times a day (TID) | INTRAMUSCULAR | Status: DC | PRN

## 2024-09-18 MED ORDER — ORAL CARE MOUTH RINSE: 15.0000 mL | Freq: Once | OROMUCOSAL | Status: AC

## 2024-09-18 SURGICAL SUPPLY — 31 items
BNDG COHESIVE 4X5 TAN STRL LF (GAUZE/BANDAGES/DRESSINGS) ×1 IMPLANT
BNDG ELASTIC 2INX 5YD STR LF (GAUZE/BANDAGES/DRESSINGS) ×1 IMPLANT
BNDG ESMARCH 4X12 STRL LF (GAUZE/BANDAGES/DRESSINGS) ×1 IMPLANT
CHLORAPREP W/TINT 26 (MISCELLANEOUS) ×1 IMPLANT
CORD BIP STRL DISP 12FT (MISCELLANEOUS) ×1 IMPLANT
CUFF TOURN SGL QUICK 18X4 (TOURNIQUET CUFF) ×1 IMPLANT
DRAPE SURG 17X11 SM STRL (DRAPES) ×1 IMPLANT
FORCEPS JEWEL BIP 4-3/4 STR (INSTRUMENTS) ×1 IMPLANT
GAUZE SPONGE 4X4 12PLY STRL (GAUZE/BANDAGES/DRESSINGS) ×1 IMPLANT
GAUZE XEROFORM 1X8 LF (GAUZE/BANDAGES/DRESSINGS) ×1 IMPLANT
GLOVE BIO SURGEON STRL SZ8 (GLOVE) ×1 IMPLANT
GLOVE INDICATOR 8.0 STRL GRN (GLOVE) ×1 IMPLANT
GOWN STRL REUS W/ TWL LRG LVL3 (GOWN DISPOSABLE) ×1 IMPLANT
GOWN STRL REUS W/ TWL XL LVL3 (GOWN DISPOSABLE) ×1 IMPLANT
KIT ESCP INSRT D SLOT CANN KN (MISCELLANEOUS) ×1 IMPLANT
KIT TURNOVER KIT A (KITS) ×1 IMPLANT
MANIFOLD NEPTUNE II (INSTRUMENTS) ×1 IMPLANT
NS IRRIG 500ML POUR BTL (IV SOLUTION) ×1 IMPLANT
PACK EXTREMITY ARMC (MISCELLANEOUS) ×1 IMPLANT
PENCIL SMOKE EVACUATOR (MISCELLANEOUS) ×1 IMPLANT
SPLINT LT WRIST LG 8.5 (SOFTGOODS) IMPLANT
SPLINT LT WRIST MD 8 (SOFTGOODS) IMPLANT
SPLINT LT WRIST XL 8.5+ LP LCK (SOFTGOODS) IMPLANT
SPLINT RT WRIST LG 8.5 (SOFTGOODS) IMPLANT
SPLINT RT WRIST MD 8 (SOFTGOODS) IMPLANT
SPLINT RT WRIST XL 8.5+ (SOFTGOODS) IMPLANT
SPLINT WRIST M RT TX990303 (SOFTGOODS) IMPLANT
STOCKINETTE IMPERVIOUS 9X36 MD (GAUZE/BANDAGES/DRESSINGS) ×1 IMPLANT
SUT PROLENE 4 0 PS 2 18 (SUTURE) ×1 IMPLANT
TRAP FLUID SMOKE EVACUATOR (MISCELLANEOUS) IMPLANT
WATER STERILE IRR 500ML POUR (IV SOLUTION) IMPLANT

## 2024-09-18 NOTE — Anesthesia Preprocedure Evaluation (Addendum)
 Anesthesia Evaluation  Patient identified by MRN, date of birth, ID band Patient awake  General Assessment Comment:  Patient states that local anesthetics don't work as well for her (dentists have to give extra lidocaine  mid-dental procedure). No issues with c/s via epidural 11 years ago, though she does note that she got ketamine intra-op, she is not sure why.  Reviewed: Allergy & Precautions, NPO status , Patient's Chart, lab work & pertinent test results  History of Anesthesia Complications Negative for: history of anesthetic complications  Airway Mallampati: I  TM Distance: >3 FB Neck ROM: Full    Dental  (+) Teeth Intact, Dental Advidsory Given, Caps   Pulmonary asthma , neg sleep apnea, neg COPD, Patient abstained from smoking.Not current smoker, former smoker   Pulmonary exam normal breath sounds clear to auscultation       Cardiovascular Exercise Tolerance: Good METS(-) hypertension(-) CAD and (-) Past MI negative cardio ROS Normal cardiovascular exam(-) dysrhythmias  Rhythm:Regular Rate:Normal - Systolic murmurs    Neuro/Psych negative neurological ROS  negative psych ROS   GI/Hepatic ,neg GERD  ,,(+)     (-) substance abuse    Endo/Other  neg diabetes    Renal/GU negative Renal ROS     Musculoskeletal   Abdominal  (+) + obese  Peds  Hematology negative hematology ROS (+) Denies bleeding disorder or anticoagulant use.   Anesthesia Other Findings Past Medical History: No date: Amenorrhea No date: Anemia No date: Excess, menstruation No date: Palpitations No date: RAD (reactive airway disease) with wheezing No date: Tachycardia No date: Transient elevated blood pressure No date: Vaginal bleeding in pregnancy  Reproductive/Obstetrics (+) Breast feeding                               Anesthesia Physical Anesthesia Plan  ASA: 2  Anesthesia Plan: General   Post-op Pain  Management: Minimal or no pain anticipated   Induction: Intravenous  PONV Risk Score and Plan: 3 and Treatment may vary due to age or medical condition, Propofol  infusion and TIVA  Airway Management Planned: LMA  Additional Equipment:   Intra-op Plan:   Post-operative Plan: Extubation in OR  Informed Consent: I have reviewed the patients History and Physical, chart, labs and discussed the procedure including the risks, benefits and alternatives for the proposed anesthesia with the patient or authorized representative who has indicated his/her understanding and acceptance.       Plan Discussed with: CRNA and Surgeon  Anesthesia Plan Comments:         Anesthesia Quick Evaluation

## 2024-09-18 NOTE — Anesthesia Procedure Notes (Signed)
 Procedure Name: LMA Insertion Date/Time: 09/18/2024 11:35 AM  Performed by: Brien Sotero PARAS, CRNAPre-anesthesia Checklist: Emergency Drugs available, Patient identified, Suction available, Patient being monitored and Timeout performed Patient Re-evaluated:Patient Re-evaluated prior to induction Oxygen Delivery Method: Circle system utilized Preoxygenation: Pre-oxygenation with 100% oxygen Induction Type: IV induction Ventilation: Mask ventilation without difficulty LMA: LMA inserted LMA Size: 4.0 Number of attempts: 1 Placement Confirmation: positive ETCO2 and breath sounds checked- equal and bilateral Tube secured with: Tape Dental Injury: Teeth and Oropharynx as per pre-operative assessment

## 2024-09-18 NOTE — Discharge Instructions (Addendum)
 Orthopedic discharge instructions: Keep dressing dry and intact. Keep hand elevated above heart level. May shower after dressing removed on postop day 4 (Sunday). Cover sutures with Band-Aids after drying off, then reapply Velcro splint. Apply ice to affected area frequently. Take ibuprofen 600 mg TID with meals for 3-5 days, then as necessary. Take ES Tylenol  if needed.  Return for follow-up in 10-14 days or as scheduled.

## 2024-09-18 NOTE — H&P (Signed)
 History of Present Illness: Wendy Hunt is a 38 y.o. female who presents today for her surgical history and physical for upcoming scheduled left endoscopic carpal tunnel release. Surgery scheduled with Dr. Edie on 09/18/2024. The patient does have confirmed moderate carpal tunnel syndrome involving bilateral upper extremities. The patient states at today's visit she is actually experiencing more symptoms in the right hand at this time, she would like to potentially undergo a right endoscopic carpal tunnel release and several left but knows at some point she will need to undergo bilateral carpal tunnel releases in the future. She denies any changes in her medical history since she was last evaluated. The patient denies any personal history of heart attack or stroke. She does have a history of a irregular heartbeat and does have a history of reactive airway disease but denies any history of COPD. She does use inhaler nightly. No history of DVT. She is not diabetic.  Past Medical History: Acute blood loss anemia (ABLA) 11/08/2023  Anemia  COVID-19 - 02/2020 and 12/2021  Irregular heart rhythm  Menstrual problem, unspecified  Palpitations  Pregnancy with uncertain fetal viability, single or unspecified fetus (HHS-HCC) 04/26/2023  RAD (reactive airway disease) (HHS-HCC)  Tachycardia   Past Surgical History: CESAREAN SECTION 10/2012  Fractional D&C with hysteroscopy 12/09/2019  REPEAT CESAREAN SECTION 11/08/2023  DILATION AND CURETTAGE OF UTERUS   Past Family History: Hyperlipidemia (Elevated cholesterol) Mother  Diabetes type II Father  Alcohol abuse Father  Eczema Son  Diabetes type II Maternal Grandfather  Breast cancer Paternal Grandmother 18  Esophageal cancer Paternal Grandfather   Medications:  albuterol  MDI, PROVENTIL , VENTOLIN , PROAIR , HFA 90 mcg/actuation inhaler Inhale 2 inhalations into the lungs every 4 (four) hours as needed for Wheezing 18 each 3  magnesium oxide (MAG-OX)  400 mg (241.3 mg magnesium) tablet Take 400 mg by mouth once daily  norethindrone  (MICRONOR ) 0.35 mg tablet Take 1 tablet (0.35 mg total) by mouth once daily 30 tablet 11  prenatal vitamin-iron-FA (PRENATE PLUS) tablet Take 1 tablet by mouth once daily   Allergies: Sulfa (Sulfonamide Antibiotics) Unknown  Epinephrine Palpitations   Review of Systems:  A comprehensive 14 point ROS was performed, reviewed by me today, and the pertinent orthopaedic findings are documented in the HPI.  Physical Exam: BP 92/68  Ht 149.9 cm (4' 11)  Wt 65.8 kg (145 lb)  BMI 29.29 kg/m  General/Constitutional: The patient appears to be well-nourished, well-developed, and in no acute distress. Neuro/Psych: Normal mood and affect, oriented to person, place and time. Eyes: Non-icteric. Pupils are equal, round, and reactive to light, and exhibit synchronous movement. ENT: Unremarkable. Lymphatic: No palpable adenopathy. Respiratory: Lungs clear to auscultation, Normal chest excursion, No wheezes, and Non-labored breathing Cardiovascular: Regular rate and rhythm. No murmurs. and No edema, swelling or tenderness, except as noted in detailed exam. Integumentary: No impressive skin lesions present, except as noted in detailed exam. Musculoskeletal: Unremarkable, except as noted in detailed exam.  Right wrist/hand exam: Skin inspection of the right wrist and hand is unremarkable. No swelling, erythema, ecchymosis, abrasions, or other skin abnormalities are identified. She has no tenderness palpation over the dorsal or volar aspect of the wrist, nor does she have any tenderness to palpation over the dorsal or palmar aspects of the hand. She exhibits full active and passive range of motion of the wrist without any pain or catching. She is able to actively flex and extend all digits fully without any pain or triggering. She is neurovascularly intact  to the right wrist and hand. She exhibits a positive Phalen's test as well  as a positive Tinel's test over the carpal tunnel.  Imaging: EMG is has been performed and is available for review. These results have been reviewed by myself. By report, the study demonstrates evidence of chronic moderate bilateral carpal tunnel syndrome. This report was reviewed by myself and discussed with the patient.  Impression: Carpal tunnel syndrome, right.  Plan:  1. Treatment options were discussed today with the patient. 2. The patient is currently scheduled for a left endoscopic carpal tunnel release with Dr. Edie on 09/18/24. The patient would like to switch this to a right wrist procedure. I have sent a message to our surgery schedulers to get this can be done at this time with insurance approval. 3. The patient was instructed on the risk and benefits of a endoscopic carpal release Proceed with surgery at this time. 4. This document will serve as her surgical history and physical for upcoming surgery. 5. She will follow-up per standard postop protocol. 6. They can call the clinic they have any questions, new symptoms develop or symptoms worsen.  The procedure was discussed with the patient, as were the potential risks (including bleeding, infection, nerve and/or blood vessel injury, persistent or recurrent pain, failure of the release, need for further surgery, blood clots, strokes, heart attacks and/or arhythmias, pneumonia, etc.) and benefits. The patient states her understanding and wishes to proceed.    H&P reviewed and patient re-examined. No changes.

## 2024-09-18 NOTE — Transfer of Care (Signed)
 Immediate Anesthesia Transfer of Care Note  Patient: Wendy Hunt  Procedure(s) Performed: RELEASE, CARPAL TUNNEL, ENDOSCOPIC (Right: Wrist)  Patient Location: PACU  Anesthesia Type:General  Level of Consciousness: sedated  Airway & Oxygen Therapy: Patient Spontanous Breathing and Patient connected to face mask oxygen  Post-op Assessment: Report given to RN and Post -op Vital signs reviewed and stable  Post vital signs: Reviewed  Last Vitals:  Vitals Value Taken Time  BP 102/56 09/18/24 12:15  Temp    Pulse 90 09/18/24 12:15  Resp 18 09/18/24 12:15  SpO2 100 % 09/18/24 12:15  Vitals shown include unfiled device data.  Last Pain:  Vitals:   09/18/24 1038  TempSrc: Temporal  PainSc: 0-No pain         Complications: There were no known notable events for this encounter.

## 2024-09-18 NOTE — Op Note (Signed)
 09/18/2024  12:10 PM  Patient:   Wendy Hunt  Pre-Op Diagnosis:   Right carpal tunnel syndrome.  Post-Op Diagnosis:   Same.  Procedure:   Endoscopic right carpal tunnel release.  Surgeon:   DOROTHA Reyes Maltos, MD  Anesthesia:   General LMA  Findings:   As above.  Complications:   None  EBL:   0 cc  Fluids:   600 cc crystalloid  TT:   9 minutes at 250 mmHg  Drains:   None  Closure:   4-0 Prolene interrupted sutures  Brief Clinical Note:   The patient is a 38 year old female with a history progressive worsening pain and paresthesias to her right hand. Her symptoms have progressed despite medications, activity modification, etc. Her history and examination consistent with carpal tunnel syndrome confirmed by EMG. The patient presents at this time for an endoscopic right carpal tunnel release.   Procedure:   The patient was brought into the operating room and lain in the supine position. After adequate general laryngeal mask anesthesia was obtained, the right hand and upper extremity were prepped with ChloraPrep solution before being draped sterilely. Preoperative antibiotics were administered. A timeout was performed to verify the appropriate surgical site before the limb was exsanguinated with an Esmarch and the tourniquet inflated to 250 mmHg.   An approximately 1.5-2 cm incision was made over the volar wrist flexion crease, centered over the palmaris longus tendon. The incision was carried down through the subcutaneous tissues with care taken to identify and protect any neurovascular structures. The distal forearm fascia was penetrated just proximal to the transverse carpal ligament. The soft tissues were released off the superficial and deep surfaces of the distal forearm fascia and this was released proximally for 3-4 cm under direct visualization.  Attention was directed distally. The Therapist, nutritional was passed beneath the transverse carpal ligament along the ulnar aspect of  the carpal tunnel and used to release any adhesions as well as to remove any adherent synovial tissue before first the smaller then the larger of the two dilators were passed beneath the transverse carpal ligament along the ulnar margin of the carpal tunnel. The slotted cannula was introduced and the endoscope was placed into the slotted cannula and the undersurface of the transverse carpal ligament visualized. The distal margin of the transverse carpal ligament was marked by placing a 25-gauge needle percutaneously at Kaplan's cardinal point so that it entered the distal portion of the slotted cannula. Under endoscopic visualization, the transverse carpal ligament was released from proximal to distal using the end-cutting blade. A second pass was performed to ensure complete release of the ligament. The adequacy of release was verified both endoscopically and by palpation using the freer elevator.  The wound was irrigated thoroughly with sterile saline solution before being closed using 4-0 Prolene interrupted sutures. A total of 10 cc of 0.5% plain Sensorcaine  was injected in and around the incision before a sterile bulky dressing was applied to the wound. The patient was placed into a volar wrist splint before being awakened, extubated, and returned to the recovery room in satisfactory condition after tolerating the procedure well.

## 2024-09-18 NOTE — Anesthesia Postprocedure Evaluation (Signed)
 Anesthesia Post Note  Patient: [name]  Procedure(s) Performed: RELEASE, CARPAL TUNNEL, ENDOSCOPIC (Right: Wrist)  Patient location during evaluation: PACU Anesthesia Type: General Level of consciousness: awake and alert Pain management: pain level controlled Vital Signs Assessment: post-procedure vital signs reviewed and stable Respiratory status: spontaneous breathing, nonlabored ventilation, respiratory function stable and patient connected to nasal cannula oxygen Cardiovascular status: blood pressure returned to baseline and stable Postop Assessment: no apparent nausea or vomiting Anesthetic complications: no   There were no known notable events for this encounter.   Last Vitals:  Vitals:   09/18/24 1259 09/18/24 1306  BP: 123/71 132/82  Pulse: 61 62  Resp: 16 18  Temp: 36.9 C 36.4 C  SpO2: 98% 99%    Last Pain:  Vitals:   09/18/24 1306  TempSrc: Temporal  PainSc: 5                  Prentice Murphy

## 2024-09-19 ENCOUNTER — Encounter: Payer: Self-pay | Admitting: Surgery

## 2024-10-22 ENCOUNTER — Other Ambulatory Visit

## 2024-10-29 ENCOUNTER — Ambulatory Visit: Admit: 2024-10-29 | Admitting: Surgery

## 2024-10-29 SURGERY — RELEASE, CARPAL TUNNEL, ENDOSCOPIC
Anesthesia: Choice | Site: Wrist | Laterality: Left

## 2024-11-04 ENCOUNTER — Other Ambulatory Visit: Payer: Self-pay | Admitting: Surgery

## 2024-11-05 ENCOUNTER — Inpatient Hospital Stay: Admission: RE | Admit: 2024-11-05 | Source: Ambulatory Visit

## 2024-11-06 ENCOUNTER — Encounter
Admission: RE | Admit: 2024-11-06 | Discharge: 2024-11-06 | Disposition: A | Discharge status: Home / Self Care | Source: Ambulatory Visit | Attending: Surgery | Admitting: Surgery

## 2024-11-06 DIAGNOSIS — Z01812 Encounter for preprocedural laboratory examination: Secondary | ICD-10-CM

## 2024-11-06 HISTORY — DX: Personal history of other endocrine, nutritional and metabolic disease: Z86.39

## 2024-11-06 NOTE — Patient Instructions (Addendum)
 Your procedure is scheduled on: 11/15/24 - Friday Report to the Registration Desk on the 1st floor of the [facility]. To find out your arrival time, please call [phone] between 1PM - 3PM on: 11/13/24 - Wednesday If your arrival time is 6:00 am, do not arrive before that time as the [facility] entrance doors do not open until 6:00 am.  REMEMBER: Instructions that are not followed completely may result in serious medical risk, up to and including death; or upon the discretion of your surgeon and anesthesiologist your surgery may need to be rescheduled.  Do not eat food after midnight the night before surgery.  No gum chewing or hard candies.  You may however, drink CLEAR liquids up to 2 hours before you are scheduled to arrive for your surgery. Do not drink anything within 2 hours of your scheduled arrival time.  Clear liquids include: - water  - apple juice without pulp - gatorade (not RED colors) - black coffee or tea (Do NOT add milk or creamers to the coffee or tea) Do NOT drink anything that is not on this list.   One week prior to surgery: Stop Anti-inflammatories (NSAIDS) such as Advil , Aleve, Ibuprofen , Motrin , Naproxen, Naprosyn and Aspirin based products such as Excedrin, Goody's Powder, BC Powder. You may take Tylenol  if needed for pain up until the day of surgery.  Stop ANY OVER THE COUNTER supplements until after surgery.   ON THE DAY OF SURGERY ONLY TAKE THESE MEDICATIONS WITH SIPS OF WATER:  none  Use inhaler albuterol  (VENTOLIN  HFA) on the day of surgery and bring to the hospital.   No Alcohol for 24 hours before or after surgery.  No Smoking including e-cigarettes for 24 hours before surgery.  No chewable tobacco products for at least 6 hours before surgery.  No nicotine patches on the day of surgery.  Do not use any recreational drugs for at least a week (preferably 2 weeks) before your surgery.  Please be advised that the combination of cocaine  and anesthesia may have negative outcomes, up to and including death. If you test positive for cocaine, your surgery will be cancelled.  On the morning of surgery brush your teeth with toothpaste and water, you may rinse your mouth with mouthwash if you wish. Do not swallow any toothpaste or mouthwash.  Use CHG Soap or wipes as directed on instruction sheet.  Do not wear jewelry, make-up, hairpins, clips or nail polish.  For welded (permanent) jewelry: bracelets, anklets, waist bands, etc.  Please have this removed prior to surgery.  If it is not removed, there is a chance that hospital personnel will need to cut it off on the day of surgery.  Do not wear lotions, powders, or perfumes.   Do not shave body hair from the neck down 48 hours before surgery.  Contact lenses, hearing aids and dentures may not be worn into surgery.  Do not bring valuables to the hospital. [facility] is not responsible for any missing/lost belongings or valuables.   Notify your doctor if there is any change in your medical condition (cold, fever, infection).  Wear comfortable clothing (specific to your surgery type) to the hospital.  After surgery, you can help prevent lung complications by doing breathing exercises.  Take deep breaths and cough every 1-2 hours. Your doctor may order a device called an Incentive Spirometer to help you take deep breaths.  If you are being admitted to the hospital overnight, leave your suitcase in  the car. After surgery it may be brought to your room.  In case of increased patient census, it may be necessary for you, the patient, to continue your postoperative care in the Same Day Surgery department.  If you are being discharged the day of surgery, you will not be allowed to drive home. You will need a responsible individual to drive you home and stay with you for 24 hours after surgery.   If you are taking public transportation, you will need to have a responsible  individual with you.  Please call the Pre-admissions Testing Dept. at [phone] if you have any questions about these instructions.  Surgery Visitation Policy:  Patients having surgery or a procedure may have two visitors.  Children under the age of 49 must have an adult with them who is not the patient.  Inpatient Visitation:    Visiting hours are 7 a.m. to 8 p.m. Up to four visitors are allowed at one time in a patient room. The visitors may rotate out with other people during the day.  One visitor age 67 or older may stay with the patient overnight and must be in the room by 8 p.m.   Merchandiser, Retail to address health-related social needs:  [email or web address]                                                                                                             Preparing for Surgery with CHLORHEXIDINE  GLUCONATE (CHG) Soap  Chlorhexidine  Gluconate (CHG) Soap  o An antiseptic cleaner that kills germs and bonds with the skin to continue killing germs even after washing  o Used for showering the night before surgery and morning of surgery  Before surgery, you can play an important role by reducing the number of germs on your skin.  CHG (Chlorhexidine  gluconate) soap is an antiseptic cleanser which kills germs and bonds with the skin to continue killing germs even after washing.  Please do not use if you have an allergy to CHG or antibacterial soaps. If your skin becomes reddened/irritated stop using the CHG.  1. Shower the NIGHT BEFORE SURGERY with CHG soap.  2. If you choose to wash your hair, wash your hair first as usual with your normal shampoo.  3. After shampooing, rinse your hair and body thoroughly to remove the shampoo.  4. Use CHG as you would any other liquid soap. You can apply CHG directly to the skin and wash gently with a clean washcloth.  5. Apply the CHG soap to your body only from the neck down. Do not use on open wounds or  open sores. Avoid contact with your eyes, ears, mouth, and genitals (private parts). Wash face and genitals (private parts) with your normal soap.  6. Wash thoroughly, paying special attention to the area where your surgery will be performed.  7. Thoroughly rinse your body with warm water.  8. Do not shower/wash with your normal soap after using and rinsing off the CHG soap.  9. Do not use lotions, oils, etc.,  after showering with CHG.  10. Pat yourself dry with a clean towel.  11. Wear clean pajamas to bed the night before surgery.  12. Place clean sheets on your bed the night of your shower and do not sleep with pets.  13. Do not apply any deodorants/lotions/powders.  14. Please wear clean clothes to the hospital.  15. Remember to brush your teeth with your regular toothpaste.

## 2024-11-15 ENCOUNTER — Ambulatory Visit: Payer: Self-pay | Admitting: Urgent Care

## 2024-11-15 ENCOUNTER — Encounter
Admission: RE | Disposition: A | Discharge status: Home / Self Care | Payer: Self-pay | Source: Home / Self Care | Attending: Surgery

## 2024-11-15 ENCOUNTER — Encounter: Payer: Self-pay | Admitting: Surgery

## 2024-11-15 ENCOUNTER — Other Ambulatory Visit: Payer: Self-pay

## 2024-11-15 ENCOUNTER — Ambulatory Visit
Admission: RE | Admit: 2024-11-15 | Discharge: 2024-11-15 | Disposition: A | Discharge status: Home / Self Care | Attending: Surgery | Admitting: Surgery

## 2024-11-15 ENCOUNTER — Ambulatory Visit: Admitting: Anesthesiology

## 2024-11-15 DIAGNOSIS — Z87891 Personal history of nicotine dependence: Secondary | ICD-10-CM | POA: Insufficient documentation

## 2024-11-15 DIAGNOSIS — G5602 Carpal tunnel syndrome, left upper limb: Secondary | ICD-10-CM | POA: Diagnosis present

## 2024-11-15 DIAGNOSIS — Z01812 Encounter for preprocedural laboratory examination: Secondary | ICD-10-CM

## 2024-11-15 HISTORY — PX: CARPAL TUNNEL RELEASE: SHX101

## 2024-11-15 LAB — POCT PREGNANCY, URINE: Preg Test, Ur: NEGATIVE

## 2024-11-15 SURGERY — RELEASE, CARPAL TUNNEL, ENDOSCOPIC
Anesthesia: General | Site: Wrist | Laterality: Left

## 2024-11-15 MED ORDER — 0.9 % SODIUM CHLORIDE (POUR BTL) OPTIME
TOPICAL | Status: DC | PRN
  Administered 2024-11-15: 500 mL

## 2024-11-15 MED ORDER — DROPERIDOL 2.5 MG/ML IJ SOLN
0.6250 mg | Freq: Once | INTRAMUSCULAR | Status: DC | PRN
Start: 2024-11-15 — End: 2024-11-15

## 2024-11-15 MED ORDER — MIDAZOLAM HCL 2 MG/2ML IJ SOLN
INTRAMUSCULAR | Status: AC
  Filled 2024-11-15: qty 2

## 2024-11-15 MED ORDER — ONDANSETRON HCL 4 MG/2ML IJ SOLN: 4.0000 mg | Freq: Four times a day (QID) | INTRAMUSCULAR | Status: DC | PRN

## 2024-11-15 MED ORDER — CEFAZOLIN SODIUM-DEXTROSE 2-4 GM/100ML-% IV SOLN
2.0000 g | INTRAVENOUS | Status: AC
  Administered 2024-11-15: 2 g via INTRAVENOUS

## 2024-11-15 MED ORDER — ACETAMINOPHEN 10 MG/ML IV SOLN: 1000.0000 mg | Freq: Once | INTRAVENOUS | Status: DC | PRN

## 2024-11-15 MED ORDER — FENTANYL CITRATE (PF) 100 MCG/2ML IJ SOLN
INTRAMUSCULAR | Status: AC
  Filled 2024-11-15: qty 2

## 2024-11-15 MED ORDER — CEFAZOLIN SODIUM-DEXTROSE 2-4 GM/100ML-% IV SOLN
INTRAVENOUS | Status: AC
  Filled 2024-11-15: qty 100

## 2024-11-15 MED ORDER — LACTATED RINGERS IV SOLN: INTRAVENOUS | Status: DC

## 2024-11-15 MED ORDER — MIDAZOLAM HCL (PF) 2 MG/2ML IJ SOLN
INTRAMUSCULAR | Status: DC | PRN
  Administered 2024-11-15: 2 mg via INTRAVENOUS

## 2024-11-15 MED ORDER — CHLORHEXIDINE GLUCONATE 0.12 % MT SOLN
15.0000 mL | Freq: Once | OROMUCOSAL | Status: AC
  Administered 2024-11-15: 15 mL via OROMUCOSAL

## 2024-11-15 MED ORDER — SODIUM CHLORIDE 0.9 % IV SOLN: INTRAVENOUS | Status: DC

## 2024-11-15 MED ORDER — FENTANYL CITRATE (PF) 100 MCG/2ML IJ SOLN: 25.0000 ug | INTRAMUSCULAR | Status: DC | PRN

## 2024-11-15 MED ORDER — HYDROCODONE-ACETAMINOPHEN 5-325 MG PO TABS: 1.0000 | ORAL_TABLET | ORAL | Status: DC | PRN

## 2024-11-15 MED ORDER — KETOROLAC TROMETHAMINE 30 MG/ML IJ SOLN: 30.0000 mg | Freq: Once | INTRAMUSCULAR | Status: DC

## 2024-11-15 MED ORDER — FENTANYL CITRATE (PF) 100 MCG/2ML IJ SOLN
INTRAMUSCULAR | Status: DC | PRN
  Administered 2024-11-15: 50 ug via INTRAVENOUS

## 2024-11-15 MED ORDER — ACETAMINOPHEN 325 MG PO TABS: 325.0000 mg | ORAL_TABLET | Freq: Four times a day (QID) | ORAL | Status: DC | PRN

## 2024-11-15 MED ORDER — METOCLOPRAMIDE HCL 10 MG PO TABS: 5.0000 mg | ORAL_TABLET | Freq: Three times a day (TID) | ORAL | Status: DC | PRN

## 2024-11-15 MED ORDER — BUPIVACAINE HCL (PF) 0.5 % IJ SOLN
INTRAMUSCULAR | Status: DC | PRN
  Administered 2024-11-15: 10 mL

## 2024-11-15 MED ORDER — METOCLOPRAMIDE HCL 5 MG/ML IJ SOLN: 5.0000 mg | Freq: Three times a day (TID) | INTRAMUSCULAR | Status: DC | PRN

## 2024-11-15 MED ORDER — OXYCODONE HCL 5 MG PO TABS: 5.0000 mg | ORAL_TABLET | Freq: Once | ORAL | Status: DC | PRN

## 2024-11-15 MED ORDER — ORAL CARE MOUTH RINSE: 15.0000 mL | Freq: Once | OROMUCOSAL | Status: AC

## 2024-11-15 MED ORDER — CHLORHEXIDINE GLUCONATE 0.12 % MT SOLN
OROMUCOSAL | Status: AC
  Filled 2024-11-15: qty 15

## 2024-11-15 MED ORDER — BUPIVACAINE HCL (PF) 0.5 % IJ SOLN
INTRAMUSCULAR | Status: AC
  Filled 2024-11-15: qty 30

## 2024-11-15 MED ORDER — OXYCODONE HCL 5 MG/5ML PO SOLN: 5.0000 mg | Freq: Once | ORAL | Status: DC | PRN

## 2024-11-15 MED ORDER — ONDANSETRON HCL 4 MG PO TABS: 4.0000 mg | ORAL_TABLET | Freq: Four times a day (QID) | ORAL | Status: DC | PRN

## 2024-11-15 SURGICAL SUPPLY — 29 items
BNDG COHESIVE 4X5 TAN STRL LF (GAUZE/BANDAGES/DRESSINGS) ×1 IMPLANT
BNDG ELASTIC 2INX 5YD STR LF (GAUZE/BANDAGES/DRESSINGS) ×1 IMPLANT
BNDG ESMARCH 4X12 STRL LF (GAUZE/BANDAGES/DRESSINGS) ×1 IMPLANT
CHLORAPREP W/TINT 26 (MISCELLANEOUS) ×1 IMPLANT
CORD BIP STRL DISP 12FT (MISCELLANEOUS) ×1 IMPLANT
CUFF TOURN SGL QUICK 18X4 (TOURNIQUET CUFF) ×1 IMPLANT
DRAPE SURG 17X11 SM STRL (DRAPES) ×1 IMPLANT
FORCEPS JEWEL BIP 4-3/4 STR (INSTRUMENTS) ×1 IMPLANT
GAUZE SPONGE 4X4 12PLY STRL (GAUZE/BANDAGES/DRESSINGS) ×1 IMPLANT
GAUZE XEROFORM 1X8 LF (GAUZE/BANDAGES/DRESSINGS) ×1 IMPLANT
GLOVE BIO SURGEON STRL SZ8 (GLOVE) ×1 IMPLANT
GLOVE BIOGEL PI IND STRL 8 (GLOVE) ×1 IMPLANT
GOWN STRL REUS W/ TWL LRG LVL3 (GOWN DISPOSABLE) ×1 IMPLANT
GOWN STRL REUS W/ TWL XL LVL3 (GOWN DISPOSABLE) ×1 IMPLANT
KIT ESCP INSRT D SLOT CANN KN (MISCELLANEOUS) ×1 IMPLANT
KIT TURNOVER KIT A (KITS) ×1 IMPLANT
MANIFOLD NEPTUNE II (INSTRUMENTS) ×1 IMPLANT
NS IRRIG 500ML POUR BTL (IV SOLUTION) ×1 IMPLANT
PACK EXTREMITY ARMC (MISCELLANEOUS) ×1 IMPLANT
PENCIL SMOKE EVACUATOR (MISCELLANEOUS) ×1 IMPLANT
SPLINT LT WRIST LG 8.5 (SOFTGOODS) IMPLANT
SPLINT LT WRIST MD 8 (SOFTGOODS) IMPLANT
SPLINT LT WRIST XL 8.5+ LP LCK (SOFTGOODS) IMPLANT
SPLINT RT WRIST LG 8.5 (SOFTGOODS) IMPLANT
SPLINT RT WRIST MD 8 (SOFTGOODS) IMPLANT
SPLINT RT WRIST XL 8.5+ (SOFTGOODS) IMPLANT
STOCKINETTE IMPERVIOUS 9X36 MD (GAUZE/BANDAGES/DRESSINGS) ×1 IMPLANT
SUT PROLENE 4 0 PS 2 18 (SUTURE) ×1 IMPLANT
TRAP FLUID SMOKE EVACUATOR (MISCELLANEOUS) IMPLANT

## 2024-11-15 NOTE — Transfer of Care (Signed)
 Immediate Anesthesia Transfer of Care Note  Patient: Wendy Hunt  Procedure(s) Performed: RELEASE, CARPAL TUNNEL, ENDOSCOPIC (Left: Wrist)  Patient Location: PACU  Anesthesia Type:General  Level of Consciousness: drowsy and patient cooperative  Airway & Oxygen Therapy: Patient connected to face mask oxygen  Post-op Assessment: Report given to RN  Post vital signs: stable  Last Vitals:  Vitals Value Taken Time  BP 118/83 11/15/24 11:11  Temp 36.7 C 11/15/24 11:11  Pulse 91 11/15/24 11:15  Resp 13 11/15/24 11:15  SpO2 100 % 11/15/24 11:15  Vitals shown include unfiled device data.  Last Pain:  Vitals:   11/15/24 0838  TempSrc: Temporal         Complications: No notable events documented.

## 2024-11-15 NOTE — H&P (Signed)
 HPI:  Wendy Hunt is a 38 y.o. female who presents for follow-up of her worsening pain and paresthesias to her left hand. Her previous EMG has confirmed the presence of moderate carpal tunnel syndrome in both wrists. These symptoms are worse with repetitive grasping or holding activities. She also is occasionally waking at night because of these symptoms. She feels that her left wrist and hand symptoms have worsened since undergoing the recent endoscopic right carpal tunnel release. She would like to consider surgical intervention on her left wrist at this time.  Current Outpatient Medications:  albuterol  MDI, PROVENTIL , VENTOLIN , PROAIR , HFA 90 mcg/actuation inhaler Inhale 2 inhalations into the lungs every 4 (four) hours as needed for Wheezing 18 each 3  magnesium oxide (MAG-OX) 400 mg (241.3 mg magnesium) tablet Take 400 mg by mouth once daily  norethindrone  (MICRONOR ) 0.35 mg tablet Take 1 tablet (0.35 mg total) by mouth once daily 30 tablet 11  prenatal vitamin-iron-FA (PRENATE PLUS) tablet Take 1 tablet by mouth once daily   Allergies:  Sulfa (Sulfonamide Antibiotics) Unknown  Epinephrine Palpitations   Past Medical History:  Acute blood loss anemia (ABLA) 11/08/2023  Anemia  COVID-19 (02/2020 and 12/2021)  Irregular heart rhythm  Menstrual problem, unspecified  Palpitations  Pregnancy with uncertain fetal viability, single or unspecified fetus (HHS-HCC) 04/26/2023  RAD (reactive airway disease) (HHS-HCC)  Tachycardia   Past Surgical History:  CESAREAN SECTION 10/2012  Fractional D&C with hysteroscopy 12/09/2019  REPEAT CESAREAN SECTION 11/08/2023  ENDOSCOPIC CARPAL TUNNEL RELEASE Left 09/18/2024 (Dr. Edie)  DILATION AND CURETTAGE OF UTERUS   Family History:  Hyperlipidemia (Elevated cholesterol) Mother  Diabetes type II Father No relationship  Alcohol abuse Father No relationship  Eczema Son Chase  Diabetes type II Maternal Grandfather  Breast cancer Paternal  Grandmother Maxine 61  Esophageal cancer Paternal Grandfather   Social History:   Socioeconomic History:  Marital status: Married  Tobacco Use  Smoking status: Former  Current packs/day: 0.00  Average packs/day: 1 pack/day for 20.9 years (20.9 ttl pk-yrs)  Types: Cigarettes  Start date: 12/19/2001  Quit date: 11/18/2022  Years since quitting: 1.9  Smokeless tobacco: Never  Tobacco comments:  Intermittent smoking history  Vaping Use  Vaping status: Never Used  Substance and Sexual Activity  Alcohol use: Yes  Alcohol/week: 2.0 standard drinks of alcohol  Types: 2 Standard drinks or equivalent per week  Comment: rarely  Drug use: Never  Sexual activity: Yes  Partners: Male  Birth control/protection: OCP   Social Drivers of Health:  Physicist, Medical Strain: Low Risk (10/02/2024)  Overall Financial Resource Strain (CARDIA)  Difficulty of Paying Living Expenses: Not hard at all  Food Insecurity: No Food Insecurity (10/02/2024)  Hunger Vital Sign  Worried About Running Out of Food in the Last Year: Never true  Ran Out of Food in the Last Year: Never true  Transportation Needs: No Transportation Needs (10/02/2024)  PRAPARE - Risk Analyst (Medical): No  Lack of Transportation (Non-Medical): No   Review of Systems:  A comprehensive 14 point ROS was performed, reviewed, and the pertinent orthopaedic findings are documented in the HPI.  Physical Exam: Vitals:  11/01/24 1406  BP: 110/76  Weight: 65.3 kg (144 lb)  Height: 149.9 cm (4' 11)  PainSc: 0-No pain  PainLoc: Wrist   General/Constitutional: The patient appears to be well-nourished, well-developed, and in no acute distress. Neuro/Psych: Normal mood and affect, oriented to person, place and time. Eyes: Non-icteric. Pupils are equal, round, and reactive  to light, and exhibit synchronous movement. ENT: Unremarkable. Lymphatic: No palpable adenopathy. Respiratory: Lungs clear to  auscultation, Normal chest excursion, No wheezes, and Non-labored breathing Cardiovascular: Regular rate and rhythm. No murmurs. and No edema, swelling or tenderness, except as noted in detailed exam. Integumentary: No impressive skin lesions present, except as noted in detailed exam. Musculoskeletal: Unremarkable, except as noted in detailed exam.  Left wrist/hand exam:  Skin inspection of the left wrist and hand again is unremarkable. No swelling, erythema, ecchymosis, abrasions, or other skin abnormalities are identified. There is no tenderness palpation over the dorsal or volar aspect of the wrist, nor does she experience any tenderness to palpation over the dorsal or palmar aspects of the hand. She demonstrates full active and passive range of motion of the wrist without any pain or catching. She can actively flex and extend all digits fully without any pain or triggering, and is able to oppose her thumb to the base of the little finger. She is neurovascularly intact to the left wrist and hand. She has a positive Phalen's test as well as a positive Tinel's test over the carpal tunnel.   Assessment: Carpal tunnel syndrome, left.   Plan: The treatment options were discussed with the patient. In addition, patient educational materials were provided regarding the diagnosis and treatment options. Regarding her left wrist symptoms, the patient is becoming increasingly frustrated by her symptoms and functional limitations, and is ready to consider more aggressive treatment options. Therefore, I have recommended a surgical procedure, specifically an endoscopic left carpal tunnel release. The procedure was discussed with the patient, as were the potential risks (including bleeding, infection, nerve and/or blood vessel injury, persistent or recurrent pain/paresthesias, stiffness of the fingers, weakness of grip, need for further surgery, blood clots, strokes, heart attacks and/or arhythmias, pneumonia, etc.)  and benefits. The patient states her understanding and wishes to proceed. All of the patient's questions and concerns were answered. She can call any time with further concerns. She will follow up post-surgery, routine.    H&P reviewed and patient re-examined. No changes.

## 2024-11-15 NOTE — Discharge Instructions (Signed)
 Orthopedic discharge instructions: Keep dressing dry and intact. Keep hand elevated above heart level. May shower after dressing removed on postop day 4 (Tuesday). Cover sutures with Band-Aids after drying off, then reapply Velcro splint. Apply ice to affected area frequently. Take ibuprofen  600-800 mg TID with meals for 3-5 days, then as necessary. Take ES Tylenol  if/when needed.  Return for follow-up in 10-14 days or as scheduled.

## 2024-11-15 NOTE — Anesthesia Preprocedure Evaluation (Signed)
 Anesthesia Evaluation  Patient identified by MRN, date of birth, ID band Patient awake    Reviewed: Allergy & Precautions, H&P , NPO status , Patient's Chart, lab work & pertinent test results  Airway Mallampati: II  TM Distance: >3 FB Neck ROM: full    Dental no notable dental hx.    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 No date: Excess, menstruation No date: Former smoker No date: Hemorrhoids No date: History of pineal cyst No date: IDA (iron deficiency anemia) No date: Palpitations No date: RAD (reactive airway disease) with wheezing No date: Tachycardia No date: Transient elevated blood pressure No date: Vaginal bleeding in pregnancy  Past Surgical History: 09/18/2024: CARPAL TUNNEL RELEASE; Right     Comment:  Procedure: RELEASE, CARPAL TUNNEL, ENDOSCOPIC;  Surgeon:              Edie Norleen PARAS, MD;  Location: [facility] ORS;  Service:               Orthopedics;  Laterality: Right; November 2013: CESAREAN SECTION 11/08/2023: CESAREAN SECTION; N/A     Comment:  Procedure: REPEAT CESAREAN SECTION;  Surgeon:               Schermerhorn, Debby PARAS, MD;  Location: [facility] OR[facility];                Service: Obstetrics;  Laterality: N/A; 12/09/2019: HYSTEROSCOPY WITH D & C     Comment:  Procedure: DILATATION AND CURETTAGE /HYSTEROSCOPY;                Surgeon: Schermerhorn, Debby PARAS, MD;  Location: [facility] ORS;              Service:  Gynecology;; 2003: WISDOM TOOTH EXTRACTION  BMI    Body Mass Index: 28.28 kg/m      Reproductive/Obstetrics negative OB ROS                              Anesthesia Physical Anesthesia Plan  ASA: 2  Anesthesia Plan: General LMA   Post-op Pain Management: Minimal or no pain anticipated   Induction: Intravenous  PONV Risk Score and Plan: Dexamethasone , Ondansetron , Midazolam  and Propofol  infusion  Airway Management Planned: LMA  Additional Equipment:   Intra-op Plan:   Post-operative Plan: Extubation in OR  Informed Consent: I have reviewed the patients History and Physical, chart, labs and discussed the procedure including the risks, benefits and alternatives for the proposed anesthesia with the patient or authorized representative who has indicated his/her understanding and acceptance.     Dental Advisory Given  Plan Discussed with: Anesthesiologist, CRNA and Surgeon  Anesthesia Plan Comments:          Anesthesia Quick Evaluation

## 2024-11-15 NOTE — Op Note (Signed)
 11/15/2024  11:08 AM  Patient:   Wendy Hunt  Pre-Op Diagnosis:   Left carpal tunnel syndrome.  Post-Op Diagnosis:   Same.  Procedure:   Endoscopic left carpal tunnel release.  Surgeon:   DOROTHA Reyes Maltos, MD  Anesthesia:   General LMA  Findings:   As above.  Complications:   None  EBL:   0 cc  Fluids:   400 cc crystalloid  TT:   14 minutes at 250 mmHg  Drains:   None  Closure:   4-0 Prolene interrupted sutures  Brief Clinical Note:   The patient is a 38 year old female with a history progressive worsening pain and paresthesias to her left hand. Her symptoms have progressed despite medications, activity modification, etc. Her history and examination consistent with carpal tunnel syndrome confirmed by EMG. The patient presents at this time for an endoscopic left carpal tunnel release.   Procedure:   The patient was brought into the operating room and lain in the supine position. After adequate general laryngeal mask anesthesia was obtained, the left hand and upper extremity were prepped with ChloraPrep solution before being draped sterilely. Preoperative antibiotics were administered. A timeout was performed to verify the appropriate surgical site before the limb was exsanguinated with an Esmarch and the tourniquet inflated to 250 mmHg.   An approximately 1.5-2 cm incision was made over the volar wrist flexion crease, centered over the palmaris longus tendon. The incision was carried down through the subcutaneous tissues with care taken to identify and protect any neurovascular structures. The distal forearm fascia was penetrated just proximal to the transverse carpal ligament. The soft tissues were released off the superficial and deep surfaces of the distal forearm fascia and this was released proximally for 3-4 cm under direct visualization.  Attention was directed distally. The Therapist, nutritional was passed beneath the transverse carpal ligament along the ulnar aspect of the  carpal tunnel and used to release any adhesions as well as to remove any adherent synovial tissue before first the smaller then the larger of the two dilators were passed beneath the transverse carpal ligament along the ulnar margin of the carpal tunnel. The slotted cannula was introduced and the endoscope was placed into the slotted cannula and the undersurface of the transverse carpal ligament visualized. The distal margin of the transverse carpal ligament was marked by placing a 25-gauge needle percutaneously at Kaplan's cardinal point so that it entered the distal portion of the slotted cannula. Under endoscopic visualization, the transverse carpal ligament was released from proximal to distal using the end-cutting blade. A second pass was performed to ensure complete release of the ligament. The adequacy of release was verified both endoscopically and by palpation using the freer elevator.  The wound was irrigated thoroughly with sterile saline solution before being closed using 4-0 Prolene interrupted sutures. A total of 10 cc of 0.5% plain Sensorcaine  was injected in and around the incision before a sterile bulky dressing was applied to the wound. The patient was placed into a volar wrist splint before being awakened, extubated, and returned to the recovery room in satisfactory condition after tolerating the procedure well.

## 2024-11-18 ENCOUNTER — Encounter: Payer: Self-pay | Admitting: Surgery

## 2024-11-18 NOTE — Anesthesia Postprocedure Evaluation (Signed)
 Anesthesia Post Note  Patient: Wendy Hunt  Procedure(s) Performed: RELEASE, CARPAL TUNNEL, ENDOSCOPIC (Left: Wrist)  Patient location during evaluation: PACU Anesthesia Type: General Level of consciousness: awake and alert Pain management: pain level controlled Vital Signs Assessment: post-procedure vital signs reviewed and stable Respiratory status: spontaneous breathing, nonlabored ventilation and respiratory function stable Cardiovascular status: blood pressure returned to baseline and stable Postop Assessment: no apparent nausea or vomiting Anesthetic complications: no   No notable events documented.   Last Vitals:  Vitals:   11/15/24 1205 11/15/24 1221  BP:  112/72  Pulse: 61 (!) 56  Resp: 12 16  Temp:  36.4 C  SpO2: 99% 100%    Last Pain:  Vitals:   11/15/24 1221  TempSrc: Temporal  PainSc: 0-No pain                 Camellia Merilee Louder

## 2025-01-02 ENCOUNTER — Other Ambulatory Visit: Payer: Self-pay | Admitting: Student

## 2025-01-02 ENCOUNTER — Encounter: Payer: Self-pay | Admitting: Student

## 2025-01-02 DIAGNOSIS — R221 Localized swelling, mass and lump, neck: Secondary | ICD-10-CM

## 2025-01-02 DIAGNOSIS — Z803 Family history of malignant neoplasm of breast: Secondary | ICD-10-CM

## 2025-01-02 DIAGNOSIS — N6320 Unspecified lump in the left breast, unspecified quadrant: Secondary | ICD-10-CM

## 2025-01-08 ENCOUNTER — Ambulatory Visit
Admission: RE | Admit: 2025-01-08 | Discharge: 2025-01-08 | Disposition: A | Discharge status: Home / Self Care | Source: Ambulatory Visit | Attending: Student | Admitting: Student

## 2025-01-08 ENCOUNTER — Encounter

## 2025-01-08 ENCOUNTER — Ambulatory Visit
Admission: RE | Admit: 2025-01-08 | Discharge: 2025-01-08 | Disposition: A | Source: Ambulatory Visit | Attending: Student

## 2025-01-08 ENCOUNTER — Other Ambulatory Visit

## 2025-01-08 DIAGNOSIS — N6452 Nipple discharge: Secondary | ICD-10-CM | POA: Diagnosis not present

## 2025-01-08 DIAGNOSIS — Z803 Family history of malignant neoplasm of breast: Secondary | ICD-10-CM | POA: Insufficient documentation

## 2025-01-08 DIAGNOSIS — N6320 Unspecified lump in the left breast, unspecified quadrant: Secondary | ICD-10-CM

## 2025-01-15 ENCOUNTER — Ambulatory Visit

## 2025-01-22 ENCOUNTER — Ambulatory Visit
Admission: RE | Admit: 2025-01-22 | Discharge: 2025-01-22 | Disposition: A | Source: Ambulatory Visit | Attending: Student

## 2025-01-22 DIAGNOSIS — R221 Localized swelling, mass and lump, neck: Secondary | ICD-10-CM

## 2025-08-22 ENCOUNTER — Ambulatory Visit: Admitting: Neurosurgery
